# Patient Record
Sex: Male | Born: 1955 | Race: White | Hispanic: No | Marital: Single | Smoking: Current every day smoker
Health system: Southern US, Community
[De-identification: ages and names within clinical notes are randomized; demographics above are authoritative.]

## PROBLEM LIST (undated history)

## (undated) DIAGNOSIS — F1910 Other psychoactive substance abuse, uncomplicated: Secondary | ICD-10-CM

## (undated) DIAGNOSIS — M5412 Radiculopathy, cervical region: Secondary | ICD-10-CM

## (undated) DIAGNOSIS — F909 Attention-deficit hyperactivity disorder, unspecified type: Secondary | ICD-10-CM

## (undated) DIAGNOSIS — M8580 Other specified disorders of bone density and structure, unspecified site: Secondary | ICD-10-CM

## (undated) DIAGNOSIS — C801 Malignant (primary) neoplasm, unspecified: Secondary | ICD-10-CM

## (undated) DIAGNOSIS — N419 Inflammatory disease of prostate, unspecified: Secondary | ICD-10-CM

## (undated) DIAGNOSIS — Z86010 Personal history of colonic polyps: Secondary | ICD-10-CM

## (undated) HISTORY — DX: Personal history of colonic polyps: Z86.010

## (undated) HISTORY — PX: COLOSTOMY: SHX63

## (undated) HISTORY — PX: LUMBAR DISC SURGERY: SHX700

## (undated) HISTORY — PX: CERVICAL FUSION: SHX112

## (undated) HISTORY — DX: Other specified disorders of bone density and structure, unspecified site: M85.80

## (undated) HISTORY — DX: Radiculopathy, cervical region: M54.12

## (undated) HISTORY — DX: Inflammatory disease of prostate, unspecified: N41.9

## (undated) HISTORY — DX: Other psychoactive substance abuse, uncomplicated: F19.10

## (undated) HISTORY — DX: Attention-deficit hyperactivity disorder, unspecified type: F90.9

## (undated) HISTORY — PX: INGUINAL HERNIA REPAIR: SUR1180

---

## 1998-05-09 ENCOUNTER — Encounter: Payer: Self-pay | Admitting: Orthopedic Surgery

## 1998-05-09 ENCOUNTER — Ambulatory Visit (HOSPITAL_COMMUNITY): Admission: RE | Admit: 1998-05-09 | Discharge: 1998-05-09 | Payer: Self-pay | Admitting: Orthopedic Surgery

## 1998-05-23 ENCOUNTER — Ambulatory Visit (HOSPITAL_COMMUNITY): Admission: RE | Admit: 1998-05-23 | Discharge: 1998-05-23 | Payer: Self-pay | Admitting: Orthopedic Surgery

## 1998-06-06 ENCOUNTER — Ambulatory Visit (HOSPITAL_COMMUNITY): Admission: RE | Admit: 1998-06-06 | Discharge: 1998-06-06 | Payer: Self-pay | Admitting: Radiology

## 1998-06-06 ENCOUNTER — Encounter: Payer: Self-pay | Admitting: Radiology

## 1998-06-20 ENCOUNTER — Encounter: Payer: Self-pay | Admitting: Orthopedic Surgery

## 1998-06-20 ENCOUNTER — Ambulatory Visit (HOSPITAL_COMMUNITY): Admission: RE | Admit: 1998-06-20 | Discharge: 1998-06-20 | Payer: Self-pay | Admitting: Orthopedic Surgery

## 1998-07-18 ENCOUNTER — Encounter: Payer: Self-pay | Admitting: Emergency Medicine

## 1998-07-18 ENCOUNTER — Inpatient Hospital Stay (HOSPITAL_COMMUNITY): Admission: EM | Admit: 1998-07-18 | Discharge: 1998-07-24 | Payer: Self-pay | Admitting: Emergency Medicine

## 1998-10-30 ENCOUNTER — Ambulatory Visit (HOSPITAL_COMMUNITY): Admission: RE | Admit: 1998-10-30 | Discharge: 1998-10-30 | Payer: Self-pay

## 1998-11-05 ENCOUNTER — Inpatient Hospital Stay (HOSPITAL_COMMUNITY): Admission: RE | Admit: 1998-11-05 | Discharge: 1998-11-10 | Payer: Self-pay

## 1999-05-21 ENCOUNTER — Encounter: Payer: Self-pay | Admitting: Orthopedic Surgery

## 1999-05-21 ENCOUNTER — Ambulatory Visit (HOSPITAL_COMMUNITY): Admission: RE | Admit: 1999-05-21 | Discharge: 1999-05-21 | Payer: Self-pay | Admitting: Orthopedic Surgery

## 1999-08-19 HISTORY — PX: TOTAL HIP ARTHROPLASTY: SHX124

## 1999-08-26 ENCOUNTER — Encounter: Payer: Self-pay | Admitting: Orthopedic Surgery

## 1999-09-02 ENCOUNTER — Encounter: Payer: Self-pay | Admitting: Orthopedic Surgery

## 1999-09-02 ENCOUNTER — Inpatient Hospital Stay (HOSPITAL_COMMUNITY): Admission: RE | Admit: 1999-09-02 | Discharge: 1999-09-05 | Payer: Self-pay | Admitting: Orthopedic Surgery

## 1999-11-18 ENCOUNTER — Ambulatory Visit (HOSPITAL_COMMUNITY): Admission: RE | Admit: 1999-11-18 | Discharge: 1999-11-18 | Payer: Self-pay | Admitting: Radiology

## 1999-11-18 ENCOUNTER — Encounter: Payer: Self-pay | Admitting: Radiology

## 1999-12-04 ENCOUNTER — Encounter: Payer: Self-pay | Admitting: Neurological Surgery

## 1999-12-04 ENCOUNTER — Ambulatory Visit (HOSPITAL_COMMUNITY): Admission: RE | Admit: 1999-12-04 | Discharge: 1999-12-04 | Payer: Self-pay | Admitting: Neurological Surgery

## 2000-02-12 ENCOUNTER — Ambulatory Visit (HOSPITAL_COMMUNITY): Admission: RE | Admit: 2000-02-12 | Discharge: 2000-02-12 | Payer: Self-pay | Admitting: Gastroenterology

## 2000-02-20 ENCOUNTER — Encounter: Admission: RE | Admit: 2000-02-20 | Discharge: 2000-05-20 | Payer: Self-pay | Admitting: Neurological Surgery

## 2000-04-23 ENCOUNTER — Encounter: Payer: Self-pay | Admitting: Neurological Surgery

## 2000-04-27 ENCOUNTER — Encounter: Payer: Self-pay | Admitting: Neurological Surgery

## 2000-04-27 ENCOUNTER — Inpatient Hospital Stay (HOSPITAL_COMMUNITY): Admission: RE | Admit: 2000-04-27 | Discharge: 2000-04-30 | Payer: Self-pay | Admitting: Neurological Surgery

## 2000-05-06 ENCOUNTER — Encounter: Payer: Self-pay | Admitting: Neurological Surgery

## 2000-05-06 ENCOUNTER — Encounter: Admission: RE | Admit: 2000-05-06 | Discharge: 2000-05-06 | Payer: Self-pay | Admitting: Neurological Surgery

## 2000-06-19 ENCOUNTER — Encounter: Admission: RE | Admit: 2000-06-19 | Discharge: 2000-06-19 | Payer: Self-pay | Admitting: Neurological Surgery

## 2000-06-19 ENCOUNTER — Encounter: Payer: Self-pay | Admitting: Neurological Surgery

## 2000-07-29 ENCOUNTER — Encounter: Admission: RE | Admit: 2000-07-29 | Discharge: 2000-07-29 | Payer: Self-pay | Admitting: Neurological Surgery

## 2000-07-29 ENCOUNTER — Encounter: Payer: Self-pay | Admitting: Neurological Surgery

## 2000-08-04 ENCOUNTER — Ambulatory Visit (HOSPITAL_COMMUNITY): Admission: RE | Admit: 2000-08-04 | Discharge: 2000-08-04 | Payer: Self-pay | Admitting: Neurological Surgery

## 2000-08-04 ENCOUNTER — Encounter: Payer: Self-pay | Admitting: Neurological Surgery

## 2000-08-18 HISTORY — PX: OTHER SURGICAL HISTORY: SHX169

## 2000-08-21 ENCOUNTER — Encounter: Payer: Self-pay | Admitting: Neurological Surgery

## 2000-08-21 ENCOUNTER — Encounter: Admission: RE | Admit: 2000-08-21 | Discharge: 2000-08-21 | Payer: Self-pay | Admitting: Neurological Surgery

## 2001-01-13 ENCOUNTER — Ambulatory Visit (HOSPITAL_COMMUNITY): Admission: RE | Admit: 2001-01-13 | Discharge: 2001-01-13 | Payer: Self-pay | Admitting: Neurosurgery

## 2001-01-13 ENCOUNTER — Encounter: Payer: Self-pay | Admitting: Neurosurgery

## 2001-03-17 ENCOUNTER — Encounter: Payer: Self-pay | Admitting: Neurosurgery

## 2001-03-17 ENCOUNTER — Ambulatory Visit (HOSPITAL_COMMUNITY): Admission: RE | Admit: 2001-03-17 | Discharge: 2001-03-17 | Payer: Self-pay | Admitting: Neurosurgery

## 2001-05-07 ENCOUNTER — Encounter: Admission: RE | Admit: 2001-05-07 | Discharge: 2001-05-07 | Payer: Self-pay | Admitting: Neurological Surgery

## 2001-05-07 ENCOUNTER — Encounter: Payer: Self-pay | Admitting: Neurological Surgery

## 2001-11-12 ENCOUNTER — Encounter: Admission: RE | Admit: 2001-11-12 | Discharge: 2001-11-12 | Payer: Self-pay | Admitting: Sports Medicine

## 2002-04-29 ENCOUNTER — Encounter: Admission: RE | Admit: 2002-04-29 | Discharge: 2002-04-29 | Payer: Self-pay | Admitting: Sports Medicine

## 2002-11-29 ENCOUNTER — Encounter: Admission: RE | Admit: 2002-11-29 | Discharge: 2002-11-29 | Payer: Self-pay | Admitting: Sports Medicine

## 2004-01-26 ENCOUNTER — Encounter: Admission: RE | Admit: 2004-01-26 | Discharge: 2004-01-26 | Payer: Self-pay | Admitting: Sports Medicine

## 2005-08-08 ENCOUNTER — Ambulatory Visit: Payer: Self-pay | Admitting: Sports Medicine

## 2005-11-25 ENCOUNTER — Ambulatory Visit (HOSPITAL_COMMUNITY): Admission: RE | Admit: 2005-11-25 | Discharge: 2005-11-25 | Payer: Self-pay | Admitting: General Surgery

## 2006-08-21 ENCOUNTER — Ambulatory Visit (HOSPITAL_COMMUNITY): Admission: RE | Admit: 2006-08-21 | Discharge: 2006-08-21 | Payer: Self-pay | Admitting: Gastroenterology

## 2006-09-18 DIAGNOSIS — Z860100 Personal history of colon polyps, unspecified: Secondary | ICD-10-CM

## 2006-09-18 HISTORY — DX: Personal history of colon polyps, unspecified: Z86.0100

## 2006-09-18 HISTORY — DX: Personal history of colonic polyps: Z86.010

## 2006-10-28 ENCOUNTER — Telehealth: Payer: Self-pay | Admitting: Sports Medicine

## 2009-09-19 ENCOUNTER — Encounter: Payer: Self-pay | Admitting: Internal Medicine

## 2010-02-21 ENCOUNTER — Encounter: Admission: RE | Admit: 2010-02-21 | Discharge: 2010-02-21 | Payer: Self-pay | Admitting: Endocrinology

## 2010-09-19 NOTE — Letter (Signed)
Summary: [facility]  [facility]   Imported By: Sherian Rein 09/26/2009 10:21:49  _____________________________________________________________________  External Attachment:    Type:   Image     Comment:   External Document

## 2010-10-25 ENCOUNTER — Encounter: Payer: Self-pay | Admitting: Emergency Medicine

## 2010-11-05 ENCOUNTER — Encounter: Payer: Self-pay | Admitting: Emergency Medicine

## 2010-11-05 DIAGNOSIS — F1910 Other psychoactive substance abuse, uncomplicated: Secondary | ICD-10-CM | POA: Insufficient documentation

## 2010-11-05 DIAGNOSIS — M8580 Other specified disorders of bone density and structure, unspecified site: Secondary | ICD-10-CM

## 2010-11-05 DIAGNOSIS — M5412 Radiculopathy, cervical region: Secondary | ICD-10-CM

## 2010-11-05 DIAGNOSIS — N419 Inflammatory disease of prostate, unspecified: Secondary | ICD-10-CM

## 2010-11-05 DIAGNOSIS — Z86010 Personal history of colonic polyps: Secondary | ICD-10-CM

## 2010-11-06 ENCOUNTER — Ambulatory Visit (INDEPENDENT_AMBULATORY_CARE_PROVIDER_SITE_OTHER): Payer: 59 | Admitting: Emergency Medicine

## 2010-11-06 ENCOUNTER — Encounter: Payer: Self-pay | Admitting: Emergency Medicine

## 2010-11-06 DIAGNOSIS — R05 Cough: Secondary | ICD-10-CM

## 2010-11-06 DIAGNOSIS — R059 Cough, unspecified: Secondary | ICD-10-CM | POA: Insufficient documentation

## 2010-11-06 DIAGNOSIS — J449 Chronic obstructive pulmonary disease, unspecified: Secondary | ICD-10-CM

## 2010-11-06 NOTE — Progress Notes (Signed)
Subjective:    Patient ID: Corey Arnold, male    DOB: [birth year], 55 y.o.   MRN: [id number]  Cough Pertinent negatives include no chest pain, fever, postnasal drip, rhinorrhea, shortness of breath or wheezing.  55 yo man, hx tobacco use (40 pk-yrs). Followed by Dr Evlyn Kanner. He is quite active, has good exertional tolerance. He had some CP in January '12 (now resolved), prompted a CXR that showed hyperinflation and concern for COPD. He is a self referral to evaluate for COPD. Has been treated in past for bronchitis, no pred to his knowledge. No hospitalizations for breathing  Past Medical History  Diagnosis Date  . Prostatitis   . Substance abuse   . Osteopenia   . ADHD (attention deficit hyperactivity disorder)   . History of colon polyps 09/2006  . Cervical radiculopathy at C7     Family History  Problem Relation Age of Onset  . Adopted: Yes  . Pulmonary fibrosis Father   . Stroke Mother     History   Social History  . Marital Status: Legally Separated    Spouse Name: N/A    Number of Children: 2  . Years of Education: N/A   Occupational History  . president of [employer]    Social History Main Topics  . Smoking status: Former Smoker -- 1.0 packs/day for 40 years    Types: Cigarettes    Quit date: 09/02/2010  . Smokeless tobacco: Not on file  . Alcohol Use: Yes  . Drug Use: Yes  . Sexually Active: Not on file   Other Topics Concern  . Not on file   Social History Narrative  . No narrative on file    No Known Allergies  Outpatient Encounter Prescriptions as of 11/06/2010  Medication Sig Dispense Refill  . calcium carbonate (OS-CAL) 600 MG TABS Take 600 mg by mouth 2 (two) times daily with a meal.        . Cholecalciferol (VITAMIN D) 1000 UNITS capsule Take 1,000 Units by mouth daily.        . diazepam (VALIUM) 5 MG tablet 1 tablet by mouth two times a day       . fish oil-omega-3 fatty acids 1000 MG capsule Take 1 g by mouth daily.        Marland Kitchen  HYDROcodone-acetaminophen (VICODIN) 5-500 MG per tablet 1-2  At bedtime       . Multiple Vitamin (MULTIVITAMIN) tablet Take 1 tablet by mouth daily.        Marland Kitchen acetaminophen-codeine (TYLENOL #3) 300-30 MG per tablet Take 1 tablet by mouth twice a day           Review of Systems  Constitutional: Negative for fever, activity change, appetite change and fatigue.  HENT: Negative for congestion, rhinorrhea, sneezing, postnasal drip and sinus pressure.   Respiratory: Positive for cough (baseline, in the am - prod clear/white). Negative for chest tightness, shortness of breath, wheezing and stridor.   Cardiovascular: Negative for chest pain.  Musculoskeletal: Positive for back pain.       Objective:   Physical Exam  Constitutional: He is oriented to person, place, and time. He appears well-developed and well-nourished. No distress.  HENT:  Head: Normocephalic and atraumatic.  Mouth/Throat: No oropharyngeal exudate.  Eyes: Conjunctivae are normal. Pupils are equal, round, and reactive to light.  Neck: Normal range of motion. Neck supple. No tracheal deviation present. No thyromegaly present.  Cardiovascular: Normal rate, regular rhythm and normal heart sounds.  Exam reveals  no gallop and no friction rub.   No murmur heard. Pulmonary/Chest: Effort normal and breath sounds normal. No stridor. No respiratory distress. He has no wheezes. He has no rales. He exhibits no tenderness.  Abdominal: Soft.  Musculoskeletal: Normal range of motion. He exhibits no edema.  Lymphadenopathy:    He has no cervical adenopathy.  Neurological: He is alert and oriented to person, place, and time.  Skin: Skin is warm and dry. He is not diaphoretic. No erythema.          Assessment & Plan:  Cough Resolved. Occurred in setting URI.   COPD (chronic obstructive pulmonary disease) Hyperinflation on CXR in pt with tobacco hx. No pulm limitation  At this time. Most important thing was smoking cessation which he  has done.  - full PFT to assess degree AFL - ROV to review

## 2010-11-06 NOTE — Assessment & Plan Note (Signed)
Resolved. Occurred in setting URI.

## 2010-11-06 NOTE — Assessment & Plan Note (Signed)
Hyperinflation on CXR in pt with tobacco hx. No pulm limitation  At this time. Most important thing was smoking cessation which he has done.  - full PFT to assess degree AFL - ROV to review

## 2010-11-06 NOTE — Progress Notes (Deleted)
  Subjective:    Patient ID: Corey Arnold, male    DOB: 1956/08/04, 55 y.o.   MRN: [id number]  HPI    Review of Systems  Respiratory: Positive for cough.   Cardiovascular: Positive for chest pain.  Musculoskeletal: Positive for arthralgias.  Neurological: Positive for headaches.       Objective:   Physical Exam        Assessment & Plan:

## 2010-11-06 NOTE — Patient Instructions (Signed)
We will perform full Pulmonary Function Tests at your next visit Follow up with Dr Delton Coombes next available appointment to review the results.

## 2010-12-03 ENCOUNTER — Ambulatory Visit (INDEPENDENT_AMBULATORY_CARE_PROVIDER_SITE_OTHER): Payer: BC Managed Care – PPO | Admitting: Emergency Medicine

## 2010-12-03 DIAGNOSIS — R05 Cough: Secondary | ICD-10-CM

## 2010-12-03 DIAGNOSIS — R059 Cough, unspecified: Secondary | ICD-10-CM

## 2010-12-03 DIAGNOSIS — J449 Chronic obstructive pulmonary disease, unspecified: Secondary | ICD-10-CM

## 2010-12-03 LAB — PULMONARY FUNCTION TEST

## 2010-12-03 NOTE — Progress Notes (Signed)
PFT done today. 

## 2010-12-11 ENCOUNTER — Encounter: Payer: Self-pay | Admitting: Emergency Medicine

## 2010-12-11 ENCOUNTER — Ambulatory Visit (INDEPENDENT_AMBULATORY_CARE_PROVIDER_SITE_OTHER): Payer: BC Managed Care – PPO | Admitting: Emergency Medicine

## 2010-12-11 VITALS — BP 118/82 | HR 58 | Temp 98.9°F | Ht 70.0 in | Wt 172.8 lb

## 2010-12-11 DIAGNOSIS — J4489 Other specified chronic obstructive pulmonary disease: Secondary | ICD-10-CM

## 2010-12-11 DIAGNOSIS — J449 Chronic obstructive pulmonary disease, unspecified: Secondary | ICD-10-CM

## 2010-12-11 NOTE — Progress Notes (Signed)
  Subjective:    Patient ID: Corey Arnold, male    DOB: [birth year], 55 y.o.   MRN: [id number]  HPI 55 yo man, hx tobacco 40 pk-yrs.   Pertinent negatives include no chest pain, fever, postnasal drip, rhinorrhea, shortness of breath or wheezing.  55 yo man, hx tobacco use (40 pk-yrs). Followed by Dr Evlyn Kanner. He is quite active, has good exertional tolerance. He had some CP in January '12 (now resolved), prompted a CXR that showed hyperinflation and concern for COPD. He is a self referral to evaluate for COPD. Has been treated in past for bronchitis, no pred to his knowledge. No hospitalizations for breathing  ROV 12/11/10 -- returns for f/u eval for hyperinflation and COPD. He is asymptomatic, but had hyperinflation on CXR. His PFT support mild AFL with a positive BD response, elevated TLC, normal DLCO.      Review of Systems All negative except nasal drainage.     Objective:   Physical Exam Gen: Pleasant, well-nourished, in no distress,  normal affect  ENT: No lesions,  mouth clear,  oropharynx clear, no postnasal drip  Neck: No JVD, no TMG, no carotid bruits  Lungs: No use of accessory muscles, no dullness to percussion, clear without rales or rhonchi  Cardiovascular: RRR, heart sounds normal, no murmur or gallops, no peripheral edema  Musculoskeletal: No deformities, no cyanosis or clubbing  Neuro: alert, non focal  Skin: Warm, no lesions or rashes           Assessment & Plan:

## 2010-12-11 NOTE — Patient Instructions (Signed)
Your pulmonary function testing shows evidence for mild airflow limitation In absence of any symptoms, you do not need to be on medications at this time.  Follow up with Dr Delton Coombes if your breathing changes in any way.

## 2010-12-11 NOTE — Assessment & Plan Note (Signed)
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 Asymptomatic Discussed the ramifications of the findings, may become clinically relevant in the future, probably should get annual flu shot. Will follow prn

## 2011-01-03 NOTE — Procedures (Signed)
[place]. [facility]  Patient:    Corey Arnold, Corey Arnold                       MRN: [id number] Proc. Date: 02/12/00 Adm. Date:  [id number] Attending:  Charna Elizabeth CC:         Tera Mater. Evlyn Kanner, M.D.                           Procedure Report  PROCEDURE: Colonoscopy.  ENDOSCOPIST: Anselmo Rod, M.D.  INDICATIONS: Rectal bleeding. The patient is a 55 year old white male who has had colostomy for reversal of the same for diverticulitis in the past, rule out colonic polyps, hemorrhoids. Informed consent was procured from the patient. The patient fasted for eight hours prior to the procedure and was prepped with a bottle of magnesium citrate and a gallon of NuLytely the night prior to the procedure.  PHYSICAL EXAMINATION:  VITAL SIGNS:  Vital signs are stable.  NECK: Supple.  CHEST:  Clear to auscultation.  HEART:  S1 and S2 normal.  ABDOMEN:  Soft. Well-healed surgical scar. Nontender with normal bowel sounds.  DESCRIPTION OF PROCEDURE:  The patient was placed in the left lateral decubitus position and sedated with 80 mg of Demerol and 8 mg of Versed intravenously. Once the patient was adequately sedated and maintained on low-flow oxygen, with cardiac monitoring, the Olympus video colonoscope was advanced from the rectum to the cecum without difficulty. The terminal ileum appeared healthy and so did the entire colon, except for a few diverticular pockets. An inverted diverticulum was seen at about 30 cm. The patient had small internal hemorrhoids that were nonbleeding at the time of the examination. No masses or polyps were seen. The patient tolerated the procedure well without complications.  IMPRESSION: 1. Essentially healthy-appearing colon up to the terminal ileum, except for a    few scattered diverticula. 2. Small, nonbleeding internal hemorrhoids. 3. No masses or polyps seen.  RECOMMENDATIONS:  The patient was advised to increase fluid and  fiber in his diet and followup in the office in an outpatient setting on a p.r.n. basis. Repeat colonoscopy is recommended in 5-10 years or earlier if need be. DD:  02/12/00 TD:  02/13/00 Job: [id number]

## 2011-01-03 NOTE — Op Note (Signed)
NAME:  OMEGA, SLAGER NO.:  [id number]   MEDICAL RECORD NO.:  [id number]          PATIENT TYPE:  AMB   LOCATION:  DAY                          FACILITY:  [facility]   PHYSICIAN:  Timothy E. Earlene Plater, M.D. DATE OF BIRTH:  [birth year]   DATE OF PROCEDURE:  11/25/2005  DATE OF DISCHARGE:                                 OPERATIVE REPORT   PREOPERATIVE DIAGNOSIS:  Anal condylomata.   POSTOPERATIVE DIAGNOSIS:  Anal condylomata.   PROCEDURE:  Laser destruction of anal condylomata.   SURGEON:  Timothy E. Earlene Plater, M.D.   ANESTHESIA:  General.   HISTORY:  This 55 year old male was otherwise healthy.  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  He has developed a condylomata around the  perianal area, previously treated on the genitalia.  He is well informed and  has been consulted in regards to laser destruction.  He also knows  recurrence is high.   DESCRIPTION OF PROCEDURE:  The patient was seen and identified, and permit  signed.  The patient was taken to the operating room, placed supine.  LIMA  anesthesia was provided.  He was placed in the lithotomy position.  The anal  area was clipped, prepped and draped in the usual fashion.  Careful  inspection was done using magnification.  Chondylomata were numerous;  intended to be on the elevation of the normal skin folds; however, there  were condylomata in the anoderm as well as the anodermal columnar junction.  All these areas were carefully treated with laser destruction.  CO2 laser  set on continuous, between 3 and 5 watts, and each individual condylomata  was destroyed.  Care was taken to preserve as much normal skin as possible,  but also to treat every condylomata present.  This was carried out both  using anoscope and on the external skin.  All areas were checked.  There  were no complications.  A dibucaine ointment was applied and dressings.   The patient has been carefully informed about postoperative care.  He has  Vicodin, which he will use.  I will see him in the office.      Timothy E. Earlene Plater, M.D.  Electronically Signed     TED/MEDQ  D:  11/25/2005  T:  11/25/2005  Job:  [id number]   cc:   Anselmo Rod, M.D.  Fax: [phone]   Tera Mater. Evlyn Kanner, M.D.  Fax: [phone]

## 2011-01-03 NOTE — Discharge Summary (Signed)
[place]. [facility]  Patient:    TEO, MOEDE                       MRN: [id number] Adm. Date:  [id number] Disc. Date: [id number] Attending:  Jonne Ply                           Discharge Summary  ADMITTING DIAGNOSIS:  Lumbar spondylolisthesis grade 3 with radiculopathy.  DISCHARGE DIAGNOSIS:  Lumbar spondylolisthesis grade 3 with radiculopathy.  OPERATION:  Lumbar ______ procedure plus arthrodesis L5/S1.  CONDITION ON DISCHARGE:  Improving.  HOSPITAL COURSE:  The patient is a 55 year old individual who has had significant back and bilateral leg pain.  He has a grade 3 spondylolisthesis at the L5/S1 level.  He has had progressive radicular pain.  Patient was advised regarding surgical decompression and stabilization.  This was performed via ______ procedure at L5 and L4 to sacrum arthrodesis.  He tolerated procedure well, however, he continues to have significant problems with spasms in his hamstrings.  His incision is clean, dry.  He is ambulatory at this time and overall his level of function is improving.  His pain has been controlled with Percocet orally and Valium as a muscle relaxer.  He will be seen in the office in three weeks time for further follow-up.  ______ DD:  04/30/00 TD:  05/03/00 Job: [id number]

## 2011-01-03 NOTE — Op Note (Signed)
Corey Arnold. [facility]  Patient:    Arnold, Corey                       MRN: [id number] Proc. Date: 01/13/01 Adm. Date:  [id number] Attending:  Donn Pierini                           Operative Report  PREOPERATIVE DIAGNOSES: 1. Right C4-5 herniated nucleus pulposus with radiculopathy. 2. Left C5-6 herniated nucleus pulposus with radiculopathy.  POSTOPERATIVE DIAGNOSES: 1. Right C4-5 herniated nucleus pulposus with radiculopathy. 2. Left C5-6 herniated nucleus pulposus with radiculopathy.  PROCEDURE:  C4-5 and C5-6 anterior cervical diskectomy and fusion with allograft, anterior plate instrumentation.  SURGEON:  Julio Sicks, M.D.  ASSISTANT:  Donalee Citrin, Montez Hageman., M.D.  ANESTHESIA:  General endotracheal.  INDICATIONS:  Mr. Corey Arnold is a 55 year old male with history of chronic neck and bilateral upper extremity symptoms, lately his right-sided symptoms being more pronounced.  These symptoms are quite typical for a right-sided C5 radiculopathy.  He has developed weakness in his right-sided deltoid and biceps muscle groups.  He also has chronic left-sided C6 radicular paresthesias and numbness with some intermittent weakness.  MRI scanning demonstrates a rightward C4-5 disk herniation and compression of the right-sided C5 nerve root.  At C5-6 there is a leftward disk herniation with compression of the left-sided C6 nerve root.  At C3-4, there is spondylosis, which causes some mild spinal stenosis and no significant areas of spinal nerve root compression.  The patient has been counseled as to his options.  he has failed conservative management.  The development of worsening weakness in his right-sided C5 myotome is worrisome, and we have discussed options for management, including undergoing a C4-5 and C5-6 anterior cervical diskectomy and fusion with allograft, anterior plating.  The patient is aware of the risks and benefits and wishes to proceed with  surgery.  DESCRIPTION OF PROCEDURE:  Patient in the operating room, placed on the operating table in the supine position.  After an adequate level of anesthesia was achieved, the patient was positioned supine with his neck slightly extended and held in place with Holter traction.  The patients anterior cervical region was shaved and prepped sterilely.  A 10 blade was used to make a linear skin incision overlying the C5 level.  This was carried down sharply to the platysma.  The platysma was then divided vertically and dissection proceeded along the medial border of the sternocleidomastoid muscle and carotid sheath on the right, trachea and esophagus were mobilized and retracted toward the left.  Prevertebral fascia was stripped off the anterior spinal column.  The longus colli muscle was then elevated bilaterally using electrocautery.  Deep self-retaining retractor was placed.  Intraoperative fluoroscopy was used, and the C4-5 and C5-6 levels were confirmed.  The disk spaces were then incised at both levels with 15 blade in rectangular fashion. A wide disk space cleanout was then performed at each level using pituitary rongeurs, forward and backward-angled Karlin curettes, Kerrison rongeurs, and high-speed drill.  All elements of the disk were removed down to the level of the posterior annulus at each level.  The microscope was brought in the field and used throughout the remainder of the diskectomies.  Starting first at C4-5, remaining aspects of the annulus and osteophytes were removed down to the level of the posterior longitudinal ligament.  The posterior longitudinal ligament was then elevated and resected  in piecemeal fashion using Kerrison rongeurs.  The underlying thecal sac was identified.  The decompression then proceeded out to the left-sided C5 foramen.  The nerve root was identified proximally, found to be free of any compression.  Decompression then proceeded out to the  right-sided C5 foramen.  A moderate amount of intraligamentous disk herniation was encountered.  A wide anterior foraminotomy was performed along the course of the right-sided C5 nerve root.  At the conclusion, there was no evidence of any compression of the thecal sac or exiting nerve roots. Attention then placed at the C5-6 level.  Once again the remaining aspects of the annulus and osteophytes were removed down to the level of the posterior longitudinal ligament.  The posterior longitudinal ligament was then elevated and resected in piecemeal fashion using Kerrison rongeurs.  The underlying thecal sac was identified.  A wide central decompression then performed using Kerrison rongeurs.  Decompression then proceeded out to the left-sided C6 foramen.  Once again an intraligamentous disk herniation was encountered and then completely resected using the Kerrison rongeurs.  The C6 nerve root was followed out distally into the foramen, and a wide anterior foraminotomy was performed.  The C6 nerve root was identified on the right side and decompressed as well.  At this point, there was no evidence of any spinal stenosis or nerve root compression at either level.  The wound was then copiously irrigated with antibiotic solution with Gelfoam placed topically at both levels for hemostasis, which was found to be good.  The disk space was then distracted, first at C4-5, and a 6 mm fibular wedge allograft was impacted into place, recessed approximately 1 mm from the anterior cortical surface.  At C5-6, the disk space was distracted and a 6 mm fibular wedge allograft was impacted in place, recessed approximately 1 mm from the anterior cortical surface.  A 40 mm Atlantis anterior cervical plate was then placed over the C4, C5, and C6 levels.  This was attached under fluoroscopic guidance by drilling pilot holes, tapping the pilot holes, and subsequently placing fixed-angled cancellous screws.  Fourteen  millimeter cancellous fixed-angled screws were placed, two each at C4, C5, and C6.  All screws were given a final tightening and found to be solidly within the bone.  All locking caps were  then engaged.  Retraction system removed, and final images revealed good position of bone grafts and hardware and proper operative level, with normal alignment of the spine.  Hemostasis was ensured with the bipolar electrocautery.  The wound was copiously irrigated with antibiotic solution and then closed in a routine fashion.  There was no apparent complication. The patient tolerated the procedure well, and he returns to the recovery room postoperatively. DD:  01/13/01 TD:  01/13/01 Job: [id number]

## 2011-01-03 NOTE — H&P (Signed)
[place]. [facility]  Patient:    Corey Arnold, Corey Arnold                       MRN: [id number] Adm. Date:  [id number] Disc. Date: [id number] Attending:  Charna Arnold                         History and Physical  ADMISSION DIAGNOSIS:  Spondylolisthesis L5-S1 with bilateral L5 radiculopathy.  HISTORY OF PRESENT ILLNESS:  The patient is a 55 year old ambidextrous individual who has chronic back pain for a period of about three years.  It has become increasingly more severe and manifests itself as constant low back pain with burning sensation and aching into his lower extremities.  He was treated by Dr. Jene Every and surgery had been discussed for further consideration.  He is seen at the request of Dr. Adrian Prince for further consideration of intervention.  He was initially seen in May 2000.  PAST MEDICAL HISTORY:  Reveals that the patients general health has been good; however, he had emergency surgery for a ruptured colon.  He has a colostomy which was reversed in December 2000.  He has been using Vioxx 25 mg a day for back and hip pain.  He uses Vicodin at bedtime for pain and Levsin as needed for bowel cramping.  He has had difficulties with left leg pain and difficulties with his hips, having undergone a hip replacement in the recent past.  Worse problems are exacerbated by walking any distance.  The patient was re-evaluated this past April and at that time he was having difficulty that he could not stand for more than five minutes time without experiencing severe back pain and leg pain.  He had been using Vicodin two to three per day.  SOCIAL HISTORY:  Reveals that he is recently married.  HABITS:  Note that the patient does not smoke.  He does not drink alcohol.  He quit smoking a couple of years ago and smoked from 44 to 1979.  He exercises regularly including bicycling and swimming.  REVIEW OF SYSTEMS:  Notable for abdominal pain occasionally.  Leg  pain, joint pain, swelling, arthritis and night sweats.  MEDICATIONS:  Vioxx 25 mg a day.  Glucosamine 1500 mg a day.  Vicodin as needed for pain and Levsin.  FAMILY HISTORY:  Negative for any significant medical problems.  PHYSICAL EXAMINATION:  NEUROLOGIC:  He is an alert, oriented, cooperative individual in overt distress.  He will stand straight and erect without difficulty.  Flexes forward touching his fingertips to his toes.  His motor strength is good in iliopsoas, quadriceps, tibialis anterior, and gastrocnemius.  Deep tendon reflexes are 2+ in the patella, 1+ in the Achilles. Babinskis are downgoing. Straight leg raising reproduces leg pain at 45 degrees in either lower extremity.  Patricks maneuver is negative bilaterally.  Sensation is intact to pin and light touch in the distal lower extremities.  Cranial nerve examination reveals pupils are 4 mm briskly reactive to light and accommodation.  The extraocular movements are full.  The face is symmetric to grimace.  Tongue and uvula are in the midline.  Sclerae and conjunctivae are clear.  NECK:  Reveals a normal range of motion.  No masses are palpable.  No bruits are heard.  LUNGS:  Clear to auscultation.  HEART:  Regular rate and rhythm.  ABDOMEN:  Soft.  Bowel sounds are positive.  No masses  are palpable.  EXTREMITIES:  No cyanosis, clubbing or edema.  IMPRESSION:  The patient has a spondylolithesis that is grade 2 to grade 3 at L5-S1.  He is now being admitted to undergo surgical decompression and stabilization. DD:  04/27/00 TD:  04/27/00 Job: [id number]

## 2011-01-03 NOTE — Op Note (Signed)
[place]  Patient:    Corey Arnold, Corey Arnold                       MRN: [id number] Proc. Date: 04/27/00 Adm. Date:  [id number] Attending:  Jonne Ply                           Operative Report  PREOPERATIVE DIAGNOSIS:  L5-S1 spondylolisthesis grade 3 with bilateral L5 radiculopathies.  POSTOPERATIVE DIAGNOSIS:  L5-S1 spondylolisthesis grade 3 with bilateral L5 radiculopathies.  OPERATION:  Lumbar Gil procedure L5, fixation from L4 to sacrum with pedicle screws, posterolateral fusion with local autograft and allograft.  SURGEON:  Stefani Dama, M.D.  ASSISTANT:  Payton Doughty, M.D.  ANESTHESIA:  General endotracheal.  INDICATIONS:   Patient is a 55 year old individual, who has had significant back and bilateral leg pain over a 2-year period of time.  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  Nonetheless, he has had persistent L5 radiculopathy and was found to have a grade 3 spondylolisthesis.  This had proceeded from a grade 2 spondylolisthesis noted about two years ago.  Patient was advised regarding surgical decompression and stabilization and is taken to the operating room.  DESCRIPTION OF PROCEDURE:  The patient was brought to the operating room supine on the stretcher.  After smooth induction of general endotracheal anesthesia, he was turned prone.  The back was shaved, prepped with Duraprep and draped in a sterile fashion.  A midline incision was created and carried down to the lumbodorsal fascia.  First identifiable spinous process was noted to be that of L4 on a radiograph.  Dissection was carried inferiorly and superiorly to expose all of L4, L5 and sacrum. L5 was noted to be rather sclerotic, though with dissection and further curettage around the area of the facet, the listhetic segment could be loosened and moved.  This was removed in a piecemeal fashion out to the pseudoarthritic  components identifying the L5 nerve root in the depth and protecting it carefully.  The decompression was performed completing a Bronson Curb procedure over the L5 laminar arch and pseudoarthrosis.  Initially, it was planned that a posterior interbody arthrodesis at L5-S1 would be completed.  However, when identifying the pads of the L5 nerve roots and its relationship to the L5-S1 disk space, it was felt that this could not be done safely.  The dissection was then carried out to L4.  It was noted that to decompress the L5 nerve roots and the take off, a considerable amount of the facet of L4-5 would need to be sacrificed.  This would further destabilize the L4 interspace.  It was felt then at that point that an arthrodesis from L4 to the sacrum would need to be undertaken. Dissection was carried out to the transverse process of L4, transverse process of L5 and the sacral ala.  A self-retaining retractor was placed deep into the wound at this point.  Pedicle entry sites were then chosen at L4.  After decompressing the facet joints at L5 bilaterally, pedicle entry sites were chosen at L5 and the sacral ala.  These were sequentially identified and localized with radiographic confirmation.  Then, pedicle screws were placed in L4, these were 6.2 x 40 mm length screws from the [employer] system.  The L5 was instrumented with 6.2 x 45 mm screws in the sacrum, 6.2 x 50 mm  screws were placed.  Once these were located, the screw heads were reamed to the appropriate size to allow placement of the screw caps.  The transverse processes from L4 to the sacral ala were decorticated amply.  The laminar arch from the [facility] procedure was harvested and used for local autograft.  A good sample of bone was obtained in this fashion.  Vitoss bone scaffold was added in the quantity of 10 cc mixed with blood to extend the bone sample.  With the pedicle screws being placed, the screw heads were applied.  A 65 mm rod  was trimmed to the appropriate length and placed between the screw heads and then the screw heads were sequentially locked and tightened.  Care was taken in the end to make sure that the L5 nerve roots were well exposed and decompressed bilaterally.  With this being ascertained, the bone graft was packed into the lateral gutters.  After a bone graft was packed, L5 nerve roots were again checked for patency out in the foramen.  Two small fat grafts were placed over the L5 nerve roots to protect them.  Then, the lumbodorsal fascia was closed with #1 Vicryl interrupted fashion, 2-0 Vicryl was used in the subcutaneous/subcuticular fashion, 3-0 Vicryl was used subcuticularly for the final layer.  Blood loss was estimated at 700 cc.  One unit of cell-saver blood was returned to the patient. DD:  04/27/00 TD:  04/27/00 Job: [id number]

## 2011-01-23 ENCOUNTER — Encounter: Payer: Self-pay | Admitting: Emergency Medicine

## 2011-01-28 NOTE — Progress Notes (Signed)
Lawson Fiscal, do I need to refill a med here? RB

## 2011-02-03 NOTE — Progress Notes (Signed)
Nothing needed for this patient.

## 2011-02-10 ENCOUNTER — Encounter: Payer: Self-pay | Admitting: Emergency Medicine

## 2012-01-13 ENCOUNTER — Emergency Department (HOSPITAL_COMMUNITY): Payer: BC Managed Care – PPO

## 2012-01-13 ENCOUNTER — Emergency Department (HOSPITAL_COMMUNITY)
Admission: EM | Admit: 2012-01-13 | Discharge: 2012-01-13 | Disposition: A | Discharge status: Home / Self Care | Payer: BC Managed Care – PPO | Attending: Emergency Medicine | Admitting: Emergency Medicine

## 2012-01-13 ENCOUNTER — Encounter (HOSPITAL_COMMUNITY): Payer: Self-pay | Admitting: *Deleted

## 2012-01-13 ENCOUNTER — Encounter (HOSPITAL_COMMUNITY): Payer: Self-pay | Admitting: Emergency Medicine

## 2012-01-13 ENCOUNTER — Ambulatory Visit (HOSPITAL_COMMUNITY): Admission: RE | Admit: 2012-01-13 | Payer: BC Managed Care – PPO | Source: Ambulatory Visit

## 2012-01-13 DIAGNOSIS — S61519A Laceration without foreign body of unspecified wrist, initial encounter: Secondary | ICD-10-CM

## 2012-01-13 DIAGNOSIS — M25539 Pain in unspecified wrist: Secondary | ICD-10-CM | POA: Insufficient documentation

## 2012-01-13 DIAGNOSIS — Z87891 Personal history of nicotine dependence: Secondary | ICD-10-CM | POA: Insufficient documentation

## 2012-01-13 DIAGNOSIS — Z23 Encounter for immunization: Secondary | ICD-10-CM | POA: Insufficient documentation

## 2012-01-13 DIAGNOSIS — Z981 Arthrodesis status: Secondary | ICD-10-CM | POA: Insufficient documentation

## 2012-01-13 DIAGNOSIS — F909 Attention-deficit hyperactivity disorder, unspecified type: Secondary | ICD-10-CM | POA: Insufficient documentation

## 2012-01-13 DIAGNOSIS — W268XXA Contact with other sharp object(s), not elsewhere classified, initial encounter: Secondary | ICD-10-CM | POA: Insufficient documentation

## 2012-01-13 DIAGNOSIS — S61209A Unspecified open wound of unspecified finger without damage to nail, initial encounter: Secondary | ICD-10-CM | POA: Insufficient documentation

## 2012-01-13 DIAGNOSIS — S61509A Unspecified open wound of unspecified wrist, initial encounter: Secondary | ICD-10-CM | POA: Insufficient documentation

## 2012-01-13 MED ORDER — TETANUS-DIPHTH-ACELL PERTUSSIS 5-2.5-18.5 LF-MCG/0.5 IM SUSP
0.5000 mL | Freq: Once | INTRAMUSCULAR | Status: AC
  Administered 2012-01-13: 0.5 mL via INTRAMUSCULAR
  Filled 2012-01-13 (×2): qty 0.5

## 2012-01-13 MED ORDER — HYDROCODONE-ACETAMINOPHEN 5-500 MG PO TABS: 1.0000 | ORAL_TABLET | Freq: Four times a day (QID) | ORAL | Status: AC | PRN

## 2012-01-13 NOTE — ED Notes (Signed)
Patient given discharge instructions; went over discharge instructions with patient.  Patient instructed to keep wound clean and dry, to follow up with [facility]/PCP/ED for suture removal in 9-10 days, to take Vicodin as directed, to not drive while taking narcotics, and to return to the ED for new, worsening, or concerning symptoms.

## 2012-01-13 NOTE — ED Notes (Signed)
Patient had laceration on right wrist sutured this morning; patient had to leave to attend a meeting and was told to come back this afternoon to have x-ray done.  Patient reports pain; rate [place] 5/10 on the numerical pain scale; describes pain as "burning" and "throbbing".  Gauze applied over site; no active bleeding noted.  Patient alert and oriented x4; PERRL present.  Will continue to monitor.

## 2012-01-13 NOTE — ED Provider Notes (Addendum)
History     CSN: [id number]  Arrival date & time 01/13/12  1626   None     Chief Complaint  Patient presents with  . Laceration    (Consider location/radiation/quality/duration/timing/severity/associated sxs/prior treatment) HPI  Pt seen in ed earlier with lac to wrist. Had to leave prior to xray, returned for xray.  No problems w wound. No numbness or severe pain, no new c/o.       Past Medical History  Diagnosis Date  . Prostatitis   . Substance abuse   . Osteopenia   . ADHD (attention deficit hyperactivity disorder)   . History of colon polyps 09/2006  . Cervical radiculopathy at C7     Past Surgical History  Procedure Date  . Bcca 2002  . Total hip arthroplasty 2001  . Colostomy   . Inguinal hernia repair   . Cervical fusion     C4 - C6  . Lumbar disc surgery     Family History  Problem Relation Age of Onset  . Adopted: Yes  . Pulmonary fibrosis Father   . Stroke Mother     History  Substance Use Topics  . Smoking status: Former Smoker -- 1.0 packs/day for 40 years    Types: Cigarettes    Quit date: 09/02/2010  . Smokeless tobacco: Not on file  . Alcohol Use: Yes      Review of Systems  Constitutional: Negative for fever.  Neurological: Negative for numbness.    Allergies  Latex  Home Medications   Current Outpatient Rx  Name Route Sig Dispense Refill  . CALCIUM CARBONATE 600 MG PO TABS Oral Take 600 mg by mouth 2 (two) times daily with a meal.      . VITAMIN D 1000 UNITS PO CAPS Oral Take 1,000 Units by mouth daily.      . DEXLANSOPRAZOLE 60 MG PO CPDR Oral Take 60 mg by mouth daily.    Marland Kitchen DIAZEPAM 5 MG PO TABS Oral Take 5 mg by mouth at bedtime as needed. For spasms    . OMEGA-3 FATTY ACIDS 1000 MG PO CAPS Oral Take 1 g by mouth daily.     Marland Kitchen HYDROCODONE-ACETAMINOPHEN 5-500 MG PO TABS Oral Take 1 tablet by mouth at bedtime as needed. For pain    . ONE-DAILY MULTI VITAMINS PO TABS Oral Take 1 tablet by mouth daily.       BP 144/89   Pulse 64  Temp(Src) 98.2 F (36.8 C) (Oral)  SpO2 97%  Physical Exam  Nursing note and vitals reviewed. Constitutional: He appears well-developed and well-nourished. No distress.  HENT:  Head: Atraumatic.  Neck: No tracheal deviation present.  Cardiovascular: Normal rate.   Pulmonary/Chest: Effort normal. No accessory muscle usage. No respiratory distress.  Musculoskeletal:       Sutured wound right wrist without infection. Radial pulse 2+.   Neurological: He is alert.  Skin: Skin is warm and dry.  Psychiatric: He has a normal mood and affect.    ED Course  Procedures (including critical care time)  Labs Reviewed - No data to display Dg Wrist Complete Right  01/13/2012  *RADIOLOGY REPORT*  Clinical Data: Lacerated wrist broken glass.  Wrist pain  RIGHT WRIST - COMPLETE 3+ VIEW  Comparison: None.  Findings: Mild soft tissue swelling is seen along the volar and radial aspect the wrist joint, however there is no evidence of radiopaque foreign body.  No evidence of fracture or dislocation. No other significant bone abnormality identified.  IMPRESSION:  Mild soft tissue swelling.  No evidence of fracture or radiopaque foreign body.  Original Report Authenticated By: Danae Orleans, M.D.        MDM  Xray neg for fb.   Pt requests pain rx for home.        [name], MD 01/13/12 1710  [name], MD 01/13/12 [phone]

## 2012-01-13 NOTE — ED Notes (Signed)
Called x-ray states will send for patient shortly. Patient states needs to leave for a meeting EDP notified.

## 2012-01-13 NOTE — Discharge Instructions (Signed)
Take motrin or aleve as need for pain. You may also take vicodin as need for pain. No driving when taking vicodin. Also, do not take tylenol or acetaminophen containing medication when taking vicodin. Sutures out in 9-10 days, your doctor urgent care. Return to ER if worse, infection of wound, severe pain, numbness, weakness, other concern.         Laceration Care, Adult A laceration is a cut or lesion that goes through all layers of the skin and into the tissue just beneath the skin. TREATMENT  Some lacerations may not require closure. Some lacerations may not be able to be closed due to an increased risk of infection. It is important to see your caregiver as soon as possible after an injury to minimize the risk of infection and maximize the opportunity for successful closure. If closure is appropriate, pain medicines may be given, if needed. The wound will be cleaned to help prevent infection. Your caregiver will use stitches (sutures), staples, wound glue (adhesive), or skin adhesive strips to repair the laceration. These tools bring the skin edges together to allow for faster healing and a better cosmetic outcome. However, all wounds will heal with a scar. Once the wound has healed, scarring can be minimized by covering the wound with sunscreen during the day for 1 full year. HOME CARE INSTRUCTIONS  For sutures or staples:  Keep the wound clean and dry.   If you were given a bandage (dressing), you should change it at least once a day. Also, change the dressing if it becomes wet or dirty, or as directed by your caregiver.   Wash the wound with soap and water 2 times a day. Rinse the wound off with water to remove all soap. Pat the wound dry with a clean towel.   After cleaning, apply a thin layer of the antibiotic ointment as recommended by your caregiver. This will help prevent infection and keep the dressing from sticking.   You may shower as usual after the first 24 hours. Do not  soak the wound in water until the sutures are removed.   Only take over-the-counter or prescription medicines for pain, discomfort, or fever as directed by your caregiver.   Get your sutures or staples removed as directed by your caregiver.  For skin adhesive strips:  Keep the wound clean and dry.   Do not get the skin adhesive strips wet. You may bathe carefully, using caution to keep the wound dry.   If the wound gets wet, pat it dry with a clean towel.   Skin adhesive strips will fall off on their own. You may trim the strips as the wound heals. Do not remove skin adhesive strips that are still stuck to the wound. They will fall off in time.  For wound adhesive:  You may briefly wet your wound in the shower or bath. Do not soak or scrub the wound. Do not swim. Avoid periods of heavy perspiration until the skin adhesive has fallen off on its own. After showering or bathing, gently pat the wound dry with a clean towel.   Do not apply liquid medicine, cream medicine, or ointment medicine to your wound while the skin adhesive is in place. This may loosen the film before your wound is healed.   If a dressing is placed over the wound, be careful not to apply tape directly over the skin adhesive. This may cause the adhesive to be pulled off before the wound is healed.  Avoid prolonged exposure to sunlight or tanning lamps while the skin adhesive is in place. Exposure to ultraviolet light in the first year will darken the scar.   The skin adhesive will usually remain in place for 5 to 10 days, then naturally fall off the skin. Do not pick at the adhesive film.  You may need a tetanus shot if:  You cannot remember when you had your last tetanus shot.   You have never had a tetanus shot.  If you get a tetanus shot, your arm may swell, get red, and feel warm to the touch. This is common and not a problem. If you need a tetanus shot and you choose not to have one, there is a rare chance of  getting tetanus. Sickness from tetanus can be serious. SEEK MEDICAL CARE IF:   You have redness, swelling, or increasing pain in the wound.   You see a red line that goes away from the wound.   You have yellowish-white fluid (pus) coming from the wound.   You have a fever.   You notice a bad smell coming from the wound or dressing.   Your wound breaks open before or after sutures have been removed.   You notice something coming out of the wound such as wood or glass.   Your wound is on your hand or foot and you cannot move a finger or toe.  SEEK IMMEDIATE MEDICAL CARE IF:   Your pain is not controlled with prescribed medicine.   You have severe swelling around the wound causing pain and numbness or a change in color in your arm, hand, leg, or foot.   Your wound splits open and starts bleeding.   You have worsening numbness, weakness, or loss of function of any joint around or beyond the wound.   You develop painful lumps near the wound or on the skin anywhere on your body.  MAKE SURE YOU:   Understand these instructions.   Will watch your condition.   Will get help right away if you are not doing well or get worse.  Document Released: 08/04/2005 Document Revised: 07/24/2011 Document Reviewed: 01/28/2011 [facility] Patient Information 2012 [place], [place].

## 2012-01-13 NOTE — ED Notes (Signed)
Patient seen in ED today for laceration right wrist.  Stated had to leave returned to ED for x-ray right wrist per Doctor's recommendation.
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
## 2012-01-13 NOTE — ED Notes (Signed)
Patient spoke with Doctor and Nurse stated he needs to leave recommendation to wait for x-ray and if must leave return for x-ray.  Verbalized understanding.

## 2012-01-13 NOTE — Discharge Instructions (Signed)
Keep wound very clean. Avoid strong flexion or extension of wrist/digits until after sutures out avoid tearing sutures.  Have sutures removed, your doctor or urgent care in 9-10 days. Return for xray today as planned - as we discussed, there is the possibility of small foreign body not felt or detected on your exam. Return to ER if worse, spreading redness, increased swelling, severe pain, pus, other concern. Follow up with hand surgeon in next couple days if any numbness, or problem with normal function of hand/digits.       Laceration Care, Adult A laceration is a cut or lesion that goes through all layers of the skin and into the tissue just beneath the skin. TREATMENT  Some lacerations may not require closure. Some lacerations may not be able to be closed due to an increased risk of infection. It is important to see your caregiver as soon as possible after an injury to minimize the risk of infection and maximize the opportunity for successful closure. If closure is appropriate, pain medicines may be given, if needed. The wound will be cleaned to help prevent infection. Your caregiver will use stitches (sutures), staples, wound glue (adhesive), or skin adhesive strips to repair the laceration. These tools bring the skin edges together to allow for faster healing and a better cosmetic outcome. However, all wounds will heal with a scar. Once the wound has healed, scarring can be minimized by covering the wound with sunscreen during the day for 1 full year. HOME CARE INSTRUCTIONS  For sutures or staples:  Keep the wound clean and dry.   If you were given a bandage (dressing), you should change it at least once a day. Also, change the dressing if it becomes wet or dirty, or as directed by your caregiver.   Wash the wound with soap and water 2 times a day. Rinse the wound off with water to remove all soap. Pat the wound dry with a clean towel.   After cleaning, apply a thin layer of the  antibiotic ointment as recommended by your caregiver. This will help prevent infection and keep the dressing from sticking.   You may shower as usual after the first 24 hours. Do not soak the wound in water until the sutures are removed.   Only take over-the-counter or prescription medicines for pain, discomfort, or fever as directed by your caregiver.   Get your sutures or staples removed as directed by your caregiver.  For skin adhesive strips:  Keep the wound clean and dry.   Do not get the skin adhesive strips wet. You may bathe carefully, using caution to keep the wound dry.   If the wound gets wet, pat it dry with a clean towel.   Skin adhesive strips will fall off on their own. You may trim the strips as the wound heals. Do not remove skin adhesive strips that are still stuck to the wound. They will fall off in time.  For wound adhesive:  You may briefly wet your wound in the shower or bath. Do not soak or scrub the wound. Do not swim. Avoid periods of heavy perspiration until the skin adhesive has fallen off on its own. After showering or bathing, gently pat the wound dry with a clean towel.   Do not apply liquid medicine, cream medicine, or ointment medicine to your wound while the skin adhesive is in place. This may loosen the film before your wound is healed.   If a dressing is placed over  the wound, be careful not to apply tape directly over the skin adhesive. This may cause the adhesive to be pulled off before the wound is healed.   Avoid prolonged exposure to sunlight or tanning lamps while the skin adhesive is in place. Exposure to ultraviolet light in the first year will darken the scar.   The skin adhesive will usually remain in place for 5 to 10 days, then naturally fall off the skin. Do not pick at the adhesive film.  You may need a tetanus shot if:  You cannot remember when you had your last tetanus shot.   You have never had a tetanus shot.  If you get a tetanus  shot, your arm may swell, get red, and feel warm to the touch. This is common and not a problem. If you need a tetanus shot and you choose not to have one, there is a rare chance of getting tetanus. Sickness from tetanus can be serious. SEEK MEDICAL CARE IF:   You have redness, swelling, or increasing pain in the wound.   You see a red line that goes away from the wound.   You have yellowish-white fluid (pus) coming from the wound.   You have a fever.   You notice a bad smell coming from the wound or dressing.   Your wound breaks open before or after sutures have been removed.   You notice something coming out of the wound such as wood or glass.   Your wound is on your hand or foot and you cannot move a finger or toe.  SEEK IMMEDIATE MEDICAL CARE IF:   Your pain is not controlled with prescribed medicine.   You have severe swelling around the wound causing pain and numbness or a change in color in your arm, hand, leg, or foot.   Your wound splits open and starts bleeding.   You have worsening numbness, weakness, or loss of function of any joint around or beyond the wound.   You develop painful lumps near the wound or on the skin anywhere on your body.  MAKE SURE YOU:   Understand these instructions.   Will watch your condition.   Will get help right away if you are not doing well or get worse.  Document Released: 08/04/2005 Document Revised: 07/24/2011 Document Reviewed: 01/28/2011 [facility] Patient Information 2012 [place], [place].
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
## 2012-01-13 NOTE — ED Provider Notes (Signed)
History  Scribed for Corey Roots, MD, the patient was seen in room STRE1/STRE1. This chart was scribed by Candelaria Stagers. The patient's care started at 11:30 AM    CSN: [id number]  Arrival date & time 01/13/12  1043   None     No chief complaint on file.    Laceration  The incident occurred less than 1 hour ago. Pain location: right dorsal wrist. The laceration is 3 cm in size. The laceration mechanism was a broken glass. The pain is mild. The pain has been constant since onset. He reports no foreign bodies present.   Corey Arnold is a 56 y.o. male who presents to the Emergency Department complaining of a laceration to the dorsal side of the right wrist that occurred about forty minutes ago on a piece of glass from a window.  Pt reports that he does not believe glass broke off into the wound.  Pt states that he also has a cut to the left hand.  Pt had the wound wrapped with gauze upon arrival.  Pt reports that it has been a while since last tetanus shot.       Past Medical History  Diagnosis Date  . Prostatitis   . Substance abuse   . Osteopenia   . ADHD (attention deficit hyperactivity disorder)   . History of colon polyps 09/2006  . Cervical radiculopathy at C7     Past Surgical History  Procedure Date  . Bcca 2002  . Total hip arthroplasty 2001  . Colostomy   . Inguinal hernia repair   . Cervical fusion     C4 - C6    Family History  Problem Relation Age of Onset  . Adopted: Yes  . Pulmonary fibrosis Father   . Stroke Mother     History  Substance Use Topics  . Smoking status: Former Smoker -- 1.0 packs/day for 40 years    Types: Cigarettes    Quit date: 09/02/2010  . Smokeless tobacco: Not on file  . Alcohol Use: Yes      Review of Systems  Skin: Positive for wound (laceration to the right wrist).    Allergies  Review of patient's allergies indicates no known allergies.  Home Medications   Current Outpatient Rx  Name Route Sig Dispense  Refill  . ACETAMINOPHEN-CODEINE #3 300-30 MG PO TABS  Take 1 tablet by mouth twice a day     . CALCIUM CARBONATE 600 MG PO TABS Oral Take 600 mg by mouth 2 (two) times daily with a meal.      . VITAMIN D 1000 UNITS PO CAPS Oral Take 1,000 Units by mouth daily.      Marland Kitchen DIAZEPAM 5 MG PO TABS  1 tablet by mouth two times a day     . OMEGA-3 FATTY ACIDS 1000 MG PO CAPS Oral Take 1 g by mouth daily.      Marland Kitchen HYDROCODONE-ACETAMINOPHEN 5-500 MG PO TABS  1-2  At bedtime     . ONE-DAILY MULTI VITAMINS PO TABS Oral Take 1 tablet by mouth daily.        BP 136/80  Pulse 63  Temp(Src) 98.6 F (37 C) (Oral)  Resp 20  SpO2 97%  Physical Exam  Nursing note and vitals reviewed. Constitutional: He is oriented to person, place, and time. He appears well-developed and well-nourished. No distress.  HENT:  Head: Normocephalic and atraumatic.  Eyes: Right eye exhibits no discharge. Left eye exhibits no discharge.  Musculoskeletal:  Normal rom wrist and digits. Radial pulse 2+. Normal tendon fxn.   Neurological: He is alert and oriented to person, place, and time.       Normal rom hand/digits. Motor/sens intact.   Skin: He is not diaphoretic.       3 cm avulsed area of skin on the dorsal side of the right wrist.   Psychiatric: He has a normal mood and affect. His behavior is normal. Judgment and thought content normal.    ED Course  Procedures  DIAGNOSTIC STUDIES: Oxygen Saturation is 97% on room air, normal by my interpretation.    COORDINATION OF CARE:  LACERATION REPAIR 12:01 PM   12:12 PM Ordered: DG Right Wrist Complete  12:24 PM Dermabond applied to the middle    MDM    Tetanus im.   LACERATION REPAIR Performed by: Corey Arnold Authorized by: Corey Arnold Consent: Verbal consent obtained. Risks and benefits: risks, benefits and alternatives were discussed Consent given by: patient Patient identity confirmed: provided demographic data Prepped and Draped in normal  sterile fashion Wound explored  Laceration Location: right wrist  Laceration Length:  3cm, flap  No Foreign Bodies seen or palpated  Anesthesia: local infiltration  Local anesthetic: lidocaine 2% wo epinephrine  Anesthetic total: 3 ml  Irrigation method: syringe Amount of cleaning: standard  Skin closure: 4-0 prolene  Number of sutures: 5  Technique: simple interruptted  Patient tolerance: Patient tolerated the procedure well with no immediate complications.    Temporary coban/pressure dressing to ensure to hematoma accumulation under flap.  Removed, sterile dressing.   Recheck radial pulse 2+. Hand nvi.  Discussed w pt after numbing wears off, if notes any numbness, loss of normal fxn, need to f/u hand.   Xray to rule out small fb not detected on exam.     Tiny few mm distal based flap lac tip of left middle finger. Wound cleaned w sterile saline. dermabond skin closure. Discussed w pt that small amt skin will avulse, but will use as biologic dressing/dermabond.    Discussed need for xray to rule out fb. Pt voices understand, but has board meeting now. States he will return today for xray. Discussed that if fb not found/xray not done, risk infection, need to reopen wound.  Pt understands, is not willing to stay now despite attempts to facilitate xray, states will return in next couple hours.      Corey Roots, MD 01/13/12 [phone]

## 2012-05-31 ENCOUNTER — Emergency Department (HOSPITAL_COMMUNITY)
Admission: EM | Admit: 2012-05-31 | Discharge: 2012-05-31 | Disposition: A | Discharge status: Home / Self Care | Payer: BC Managed Care – PPO | Attending: Emergency Medicine | Admitting: Emergency Medicine

## 2012-05-31 ENCOUNTER — Encounter (HOSPITAL_COMMUNITY)
Admission: EM | Disposition: A | Discharge status: Home / Self Care | Payer: Self-pay | Source: Home / Self Care | Attending: Emergency Medicine

## 2012-05-31 ENCOUNTER — Emergency Department (HOSPITAL_COMMUNITY): Payer: BC Managed Care – PPO | Admitting: Anesthesiology

## 2012-05-31 ENCOUNTER — Emergency Department (HOSPITAL_COMMUNITY): Payer: BC Managed Care – PPO

## 2012-05-31 ENCOUNTER — Encounter (HOSPITAL_COMMUNITY): Payer: Self-pay

## 2012-05-31 ENCOUNTER — Encounter (HOSPITAL_COMMUNITY): Payer: Self-pay | Admitting: Anesthesiology

## 2012-05-31 DIAGNOSIS — Z96649 Presence of unspecified artificial hip joint: Secondary | ICD-10-CM

## 2012-05-31 DIAGNOSIS — X500XXA Overexertion from strenuous movement or load, initial encounter: Secondary | ICD-10-CM | POA: Insufficient documentation

## 2012-05-31 DIAGNOSIS — M899 Disorder of bone, unspecified: Secondary | ICD-10-CM | POA: Insufficient documentation

## 2012-05-31 DIAGNOSIS — T84029A Dislocation of unspecified internal joint prosthesis, initial encounter: Secondary | ICD-10-CM | POA: Insufficient documentation

## 2012-05-31 DIAGNOSIS — Y9289 Other specified places as the place of occurrence of the external cause: Secondary | ICD-10-CM | POA: Insufficient documentation

## 2012-05-31 DIAGNOSIS — F909 Attention-deficit hyperactivity disorder, unspecified type: Secondary | ICD-10-CM | POA: Insufficient documentation

## 2012-05-31 DIAGNOSIS — Z79899 Other long term (current) drug therapy: Secondary | ICD-10-CM | POA: Insufficient documentation

## 2012-05-31 HISTORY — PX: HIP CLOSED REDUCTION: SHX983

## 2012-05-31 LAB — CBC
HCT: 39.2 % (ref 39.0–52.0)
Hemoglobin: 13.8 g/dL (ref 13.0–17.0)
MCH: 33.6 pg (ref 26.0–34.0)
MCV: 95.4 fL (ref 78.0–100.0)
WBC: 11.4 10*3/uL — ABNORMAL HIGH (ref 4.0–10.5)

## 2012-05-31 LAB — BASIC METABOLIC PANEL
CO2: 25 mEq/L (ref 19–32)
Calcium: 9.2 mg/dL (ref 8.4–10.5)
Creatinine, Ser: 0.58 mg/dL (ref 0.50–1.35)
GFR calc Af Amer: >90 mL/min (ref >90)
Glucose, Bld: 94 mg/dL (ref 70–99)
Potassium: 3.6 mEq/L (ref 3.5–5.1)
Sodium: 137 mEq/L (ref 135–145)

## 2012-05-31 SURGERY — CLOSED MANIPULATION, JOINT, HIP
Anesthesia: General | Site: Hip | Laterality: Left | Wound class: Clean

## 2012-05-31 MED ORDER — ONDANSETRON HCL 4 MG/2ML IJ SOLN
INTRAMUSCULAR | Status: DC | PRN
  Administered 2012-05-31: 4 mg via INTRAVENOUS

## 2012-05-31 MED ORDER — SODIUM CHLORIDE 0.9 % IV SOLN
INTRAVENOUS | Status: DC | PRN
  Administered 2012-05-31: 16:00:00 via INTRAVENOUS

## 2012-05-31 MED ORDER — SODIUM CHLORIDE 0.9 % IV SOLN
Freq: Once | INTRAVENOUS | Status: AC
  Administered 2012-05-31: 20 mL/h via INTRAVENOUS

## 2012-05-31 MED ORDER — ONDANSETRON HCL 4 MG/2ML IJ SOLN
4.0000 mg | Freq: Once | INTRAMUSCULAR | Status: AC
  Administered 2012-05-31: 4 mg via INTRAVENOUS
  Filled 2012-05-31: qty 2

## 2012-05-31 MED ORDER — HYDROCODONE-ACETAMINOPHEN 5-325 MG PO TABS
2.0000 | ORAL_TABLET | Freq: Four times a day (QID) | ORAL | Status: DC | PRN
  Administered 2012-05-31: 2 via ORAL
  Filled 2012-05-31: qty 2

## 2012-05-31 MED ORDER — SUCCINYLCHOLINE CHLORIDE 20 MG/ML IJ SOLN
INTRAMUSCULAR | Status: DC | PRN
  Administered 2012-05-31: 80 mg via INTRAVENOUS

## 2012-05-31 MED ORDER — HYDROMORPHONE HCL PF 1 MG/ML IJ SOLN
1.0000 mg | Freq: Once | INTRAMUSCULAR | Status: AC
  Administered 2012-05-31: 1 mg via INTRAVENOUS
  Filled 2012-05-31: qty 1

## 2012-05-31 MED ORDER — SODIUM CHLORIDE 0.9 % IV SOLN: INTRAVENOUS | Status: DC

## 2012-05-31 MED ORDER — PROPOFOL 10 MG/ML IV BOLUS
INTRAVENOUS | Status: DC | PRN
  Administered 2012-05-31: 150 mg via INTRAVENOUS

## 2012-05-31 MED ORDER — METHOCARBAMOL 500 MG PO TABS
500.0000 mg | ORAL_TABLET | Freq: Once | ORAL | Status: AC
  Administered 2012-05-31: 500 mg via ORAL
  Filled 2012-05-31: qty 1

## 2012-05-31 MED ORDER — MIDAZOLAM HCL 5 MG/5ML IJ SOLN
INTRAMUSCULAR | Status: DC | PRN
  Administered 2012-05-31: 2 mg via INTRAVENOUS

## 2012-05-31 MED ORDER — METHOCARBAMOL 500 MG PO TABS: 500.0000 mg | ORAL_TABLET | Freq: Four times a day (QID) | ORAL | Status: DC

## 2012-05-31 SURGICAL SUPPLY — 19 items
BANDAGE ADHESIVE 1X3 (GAUZE/BANDAGES/DRESSINGS) ×3 IMPLANT
CLOTH BEACON ORANGE TIMEOUT ST (SAFETY) ×2 IMPLANT
GLOVE BIOGEL PI IND STRL 8 (GLOVE) ×1 IMPLANT
GLOVE BIOGEL PI IND STRL 8.5 (GLOVE) ×1 IMPLANT
GLOVE BIOGEL PI INDICATOR 8 (GLOVE)
GLOVE BIOGEL PI INDICATOR 8.5 (GLOVE)
GLOVE ECLIPSE 8.0 STRL XLNG CF (GLOVE) ×2 IMPLANT
GOWN PREVENTION PLUS LG XLONG (DISPOSABLE) ×2 IMPLANT
GOWN STRL REIN XL XLG (GOWN DISPOSABLE) ×2 IMPLANT
IMMOBILIZER KNEE 20 (SOFTGOODS) ×2
IMMOBILIZER KNEE 20 THIGH 36 (SOFTGOODS) IMPLANT
MANIFOLD NEPTUNE II (INSTRUMENTS) ×1 IMPLANT
NDL SAFETY ECLIPSE 18X1.5 (NEEDLE) ×1 IMPLANT
NEEDLE HYPO 18GX1.5 SHARP (NEEDLE)
POSITIONER SURGICAL ARM (MISCELLANEOUS) ×1 IMPLANT
SOL PREP POV-IOD 16OZ 10% (MISCELLANEOUS) ×1 IMPLANT
SPONGE GAUZE 4X4 12PLY (GAUZE/BANDAGES/DRESSINGS) ×1 IMPLANT
SYR CONTROL 10ML LL (SYRINGE) ×1 IMPLANT
TOWEL OR 17X26 10 PK STRL BLUE (TOWEL DISPOSABLE) ×2 IMPLANT

## 2012-05-31 NOTE — Anesthesia Preprocedure Evaluation (Signed)
Anesthesia Evaluation  Patient identified by MRN, date of birth, ID band Patient awake    Reviewed: Allergy & Precautions, H&P , NPO status , Patient's Chart, lab work & pertinent test results  Airway Mallampati: II TM Distance: >3 FB Neck ROM: Full    Dental No notable dental hx.    Pulmonary COPDformer smoker,  breath sounds clear to auscultation  Pulmonary exam normal       Cardiovascular negative cardio ROS  Rhythm:Regular Rate:Normal     Neuro/Psych PSYCHIATRIC DISORDERS Cervical radiculopathy at C7 s/p fusion.  Neuromuscular disease    GI/Hepatic negative GI ROS, (+)     substance abuse   ,   Endo/Other  negative endocrine ROS  Renal/GU negative Renal ROS  negative genitourinary   Musculoskeletal negative musculoskeletal ROS (+)   Abdominal   Peds negative pediatric ROS (+) ADHD Hematology negative hematology ROS (+)   Anesthesia Other Findings   Reproductive/Obstetrics negative OB ROS                           Anesthesia Physical Anesthesia Plan  ASA: III  Anesthesia Plan: General   Post-op Pain Management:    Induction: Intravenous  Airway Management Planned: LMA  Additional Equipment:   Intra-op Plan:   Post-operative Plan: Extubation in OR  Informed Consent: I have reviewed the patients History and Physical, chart, labs and discussed the procedure including the risks, benefits and alternatives for the proposed anesthesia with the patient or authorized representative who has indicated his/her understanding and acceptance.   Dental advisory given  Plan Discussed with: CRNA  Anesthesia Plan Comments:         Anesthesia Quick Evaluation

## 2012-05-31 NOTE — Interval H&P Note (Signed)
History and Physical Interval Note:  05/31/2012 4:26 PM  Corey Arnold  has presented today for surgery, with the diagnosis of dislocated left hip  The various methods of treatment have been discussed with the patient and family. After consideration of risks, benefits and other options for treatment, the patient has consented to  Procedure(s) (LRB) with comments: CLOSED MANIPULATION HIP (Left) as a surgical intervention .  The patient's history has been reviewed, patient examined, no change in status, stable for surgery.  I have reviewed the patient's chart and labs.  Questions were answered to the patient's satisfaction.     Bari Handshoe A

## 2012-05-31 NOTE — Anesthesia Postprocedure Evaluation (Signed)
  Anesthesia Post-op Note  Patient: Corey Arnold  Procedure(s) Performed: Procedure(s) (LRB): CLOSED MANIPULATION HIP (Left)  Patient Location: PACU  Anesthesia Type: General  Level of Consciousness: awake and alert   Airway and Oxygen Therapy: Patient Spontanous Breathing  Post-op Pain: mild  Post-op Assessment: Post-op Vital signs reviewed, Patient's Cardiovascular Status Stable, Respiratory Function Stable, Patent Airway and No signs of Nausea or vomiting  Post-op Vital Signs: stable  Complications: No apparent anesthesia complications

## 2012-05-31 NOTE — ED Notes (Signed)
Pt reports trying to stand on stage while being pulled up by someone else. Sts L hip "popped out." Hx L total hip arthroplasty. Outward rotation noted. Pt taken to Res A.

## 2012-05-31 NOTE — Brief Op Note (Signed)
05/31/2012  4:44 PM  PATIENT:  Corey Arnold  56 y.o. male  PRE-OPERATIVE DIAGNOSIS:  dislocated left Total  hip  POST-OPERATIVE DIAGNOSIS:  dislocated left Total  hip  PROCEDURE:  Procedure(s) (LRB) with comments: CLOSED MANIPULATION HIP (Left)  SURGEON:  Surgeon(s) and Role:    * Jacki Cones, MD - Primary  PHYSICIAN ASSISTANT: Dimitri Ped PA  ASSISTANTS: Dimitri Ped PA  ANESTHESIA:   general  EBL:     BLOOD ADMINISTERED:none  DRAINS: none   LOCAL MEDICATIONS USED:  NONE  SPECIMEN:  No Specimen  DISPOSITION OF SPECIMEN:  N/A  COUNTS:  NO Not required on a closed Proceedure.  TOURNIQUET:  * No tourniquets in log *  DICTATION: .Other Dictation: Dictation Number [id number]  PLAN OF CARE: Discharge to home after PACU  PATIENT DISPOSITION:  PACU - hemodynamically stable.   Delay start of Pharmacological VTE agent (>24hrs) due to surgical blood loss or risk of bleeding: yes

## 2012-05-31 NOTE — Progress Notes (Signed)
Patient belongings from security and pharmacy returned to pt.

## 2012-05-31 NOTE — ED Notes (Signed)
Went in with security to collect pt's valuables and place in valuables envelope.  Pt requested to have his valuables with him at this time until "we know what's going on."  Pt already signed consent, prior to signing his consent this nurse asked pt if he had any questions, pt verbalized that he understood what they were doing, that he is going to the OR to have his L hip put back in place but is not going to be "cut open."  No valuables collected at this time.

## 2012-05-31 NOTE — H&P (Signed)
Corey Arnold is an 56 y.o. male.   Chief Complaint: left hip pain  HPI: The patient is a 56 year old male who presents with the chief complaint of left hip pain. He reports that yesterday around 10pm he was stepping onto a stage at a music festival. He reports that the stage was high so he had someone else hold his arm to help him up. He stepped up with his left hip, got about halfway up, and fell back due to the instability in his left hip. He had significant pain in the left hip and noticed that the foot was everting. He initially tried to walk on the left hip but had severe pain and says that his hip "felt as if walking on mush". He does report that he had "a few beers" but was not intoxicated. He did not present immediately to the ED because it was late. He presented this morning to the ED. Before the fall, he reports that the left total hip that was done but Dr. Lequita Halt 12 years ago was doing very well. He denies numbness and tingling in the legs. When he fell he also fell onto his left side, hitting his ribs. He has some pain on that side but denies chest pain and difficulty breathing.   Past Medical History  Diagnosis Date  . Prostatitis   . Substance abuse   . Osteopenia   . ADHD (attention deficit hyperactivity disorder)   . History of colon polyps 09/2006  . Cervical radiculopathy at C7     Past Surgical History  Procedure Date  . Bcca 2002  . Total hip arthroplasty 2001  . Colostomy   . Inguinal hernia repair   . Cervical fusion     C4 - C6  . Lumbar disc surgery     Family History  Problem Relation Age of Onset  . Adopted: Yes  . Pulmonary fibrosis Father   . Stroke Mother    Social History:  reports that he quit smoking about 20 months ago. His smoking use included Cigarettes. He has a 40 pack-year smoking history. He does not have any smokeless tobacco history on file. He reports that he drinks alcohol. He reports that he does not use illicit drugs.  Allergies:    Allergies  Allergen Reactions  . Latex Itching      Results for orders placed during the hospital encounter of 05/31/12 (from the past 48 hour(s))  CBC     Status: Abnormal   Collection Time   05/31/12 11:15 AM      Component Value Range Comment   WBC 11.4 (*) 4.0 - 10.5 K/uL    RBC 4.11 (*) 4.22 - 5.81 MIL/uL    Hemoglobin 13.8  13.0 - 17.0 g/dL    HCT 96.0  45.4 - 09.8 %    MCV 95.4  78.0 - 100.0 fL    MCH 33.6  26.0 - 34.0 pg    MCHC 35.2  30.0 - 36.0 g/dL    RDW 11.9  14.7 - 82.9 %    Platelets 228  150 - 400 K/uL   BASIC METABOLIC PANEL     Status: Normal   Collection Time   05/31/12 11:15 AM      Component Value Range Comment   Sodium 137  135 - 145 mEq/L    Potassium 3.6  3.5 - 5.1 mEq/L    Chloride 100  96 - 112 mEq/L    CO2 25  19 -  32 mEq/L    Glucose, Bld 94  70 - 99 mg/dL    BUN 10  6 - 23 mg/dL    Creatinine, Ser 0.45  0.50 - 1.35 mg/dL    Calcium 9.2  8.4 - 40.9 mg/dL    GFR calc non Af Amer >90  >90 mL/min    GFR calc Af Amer >90  >90 mL/min    Dg Ribs Unilateral W/chest Left  05/31/2012  *RADIOLOGY REPORT*  Clinical Data: Left-sided rib pain.  LEFT RIBS AND CHEST - 3+ VIEW  Comparison: None  Findings: The cardiac silhouette, mediastinal and hilar contours are within normal limits.  The lungs are clear.  No pneumothorax or pleural effusion.  The bony thorax is intact.  Dedicated views of the left ribs demonstrate no definite acute left-sided rib fractures.  IMPRESSION:  1.  No acute cardiopulmonary findings. 2.  No definite acute left-sided rib fractures.   Original Report Authenticated By: P. Loralie Champagne, M.D.    Dg Hip Complete Left  05/31/2012  *RADIOLOGY REPORT*  Clinical Data: Left hip pain.  LEFT HIP - COMPLETE 2+ VIEW  Comparison: None  Findings: The left femoral head prosthesis is dislocated superiorly and posteriorly.  No fractures identified.  IMPRESSION: Left femoral head prosthesis dislocation.   Original Report Authenticated By: P. Loralie Champagne, M.D.     Review of Systems  Constitutional: Negative.   HENT: Positive for neck pain.   Eyes: Negative.   Respiratory: Negative.   Cardiovascular: Negative.   Gastrointestinal: Negative.   Genitourinary: Negative.   Musculoskeletal: Positive for back pain, joint pain and falls. Negative for myalgias.       Left hip pain after a fall off a stage due to hip dislocation  Skin: Negative.   Neurological: Negative.   Endo/Heme/Allergies: Negative.   Psychiatric/Behavioral: Negative.     Blood pressure 113/95, pulse 64, temperature 98.5 F (36.9 C), temperature source Oral, resp. rate 18, SpO2 97.00%. Physical Exam  Constitutional: He is oriented to person, place, and time. He appears well-developed and well-nourished. No distress.  HENT:  Head: Normocephalic and atraumatic.  Right Ear: External ear normal.  Left Ear: External ear normal.  Nose: Nose normal.  Mouth/Throat: Oropharynx is clear and moist.  Eyes: Conjunctivae normal and EOM are normal.  Neck: Normal range of motion. Neck supple. No thyromegaly present.  Cardiovascular: Normal rate, regular rhythm, normal heart sounds and intact distal pulses.   No murmur heard. Respiratory: Effort normal. No respiratory distress. He has no wheezes. He has no rales. He exhibits tenderness.       Left sided rib tenderness  GI: Soft. Bowel sounds are normal. He exhibits no distension and no mass. There is no tenderness.  Musculoskeletal:       Right hip: Normal.       Left hip: He exhibits decreased range of motion, decreased strength and deformity. He exhibits no tenderness.       Right knee: Normal.       Left knee: Normal.       Right ankle: Normal.       Left ankle: Normal.       Legs: Lymphadenopathy:    He has no cervical adenopathy.  Neurological: He is alert and oriented to person, place, and time. He has normal reflexes. No sensory deficit.  Skin: No rash noted. He is not diaphoretic. No erythema.  Psychiatric:  He has a normal mood and affect.     Assessment/Plan Dislocated left  total hip. He needs to have the dislocated left total hip reduced. He has been NPO since last night around 6pm. He will remain that way. He reports that he is not on any blood thinners. He will be discharged home after.   Tehya Leath LAUREN 05/31/2012, 1:36 PM

## 2012-05-31 NOTE — ED Notes (Signed)
Pt's valuables given to security to be locked up.  Three prescriptions given to pharmacy for safe keeping.

## 2012-05-31 NOTE — Transfer of Care (Signed)
Immediate Anesthesia Transfer of Care Note  Patient: Corey Arnold  Procedure(s) Performed: Procedure(s) (LRB): CLOSED MANIPULATION HIP (Left)  Patient Location: PACU  Anesthesia Type: General  Level of Consciousness: sedated, patient cooperative and responds to stimulaton  Airway & Oxygen Therapy: Patient Spontanous Breathing and Patient connected to face mask oxgen  Post-op Assessment: Report given to PACU RN and Post -op Vital signs reviewed and stable  Post vital signs: Reviewed and stable  Complications: No apparent anesthesia complications

## 2012-05-31 NOTE — ED Provider Notes (Signed)
History     CSN: [id number]  Arrival date & time 05/31/12  1046   First MD Initiated Contact with Patient 05/31/12 1103      Chief Complaint  Patient presents with  . Hip Pain  . Hip Injury    (Consider location/radiation/quality/duration/timing/severity/associated sxs/prior treatment) HPI Pt with hx of left hip replacement presents with c/o feeling that his hip is dislocated.  He was at a music festival and was being helped up onto a stage by another person and then felt a pop.  He then fell onto his left side.  Denies striking head.  No LOC, no difficulty breathing.  Not able to bear weight on left leg and notes that his leg is rotated outward.  Movement and palpation makes pain worse.  Last po at approx 8:30am.  There are no other associated systemic symptoms, there are no other alleviating or modifying factors.   Past Medical History  Diagnosis Date  . Prostatitis   . Substance abuse   . Osteopenia   . ADHD (attention deficit hyperactivity disorder)   . History of colon polyps 09/2006  . Cervical radiculopathy at C7     Past Surgical History  Procedure Date  . Bcca 2002  . Total hip arthroplasty 2001  . Colostomy   . Inguinal hernia repair   . Cervical fusion     C4 - C6  . Lumbar disc surgery     Family History  Problem Relation Age of Onset  . Adopted: Yes  . Pulmonary fibrosis Father   . Stroke Mother     History  Substance Use Topics  . Smoking status: Former Smoker -- 1.0 packs/day for 40 years    Types: Cigarettes    Quit date: 09/02/2010  . Smokeless tobacco: Not on file  . Alcohol Use: Yes      Review of Systems ROS reviewed and all otherwise negative except for mentioned in HPI  Allergies  Latex  Home Medications   Current Outpatient Rx  Name Route Sig Dispense Refill  . CALCIUM CARBONATE 600 MG PO TABS Oral Take 600 mg by mouth daily.     Marland Kitchen VITAMIN D 1000 UNITS PO CAPS Oral Take 1,000 Units by mouth daily.      . DEXLANSOPRAZOLE 60  MG PO CPDR Oral Take 60 mg by mouth daily.    Marland Kitchen DIAZEPAM 5 MG PO TABS Oral Take 5 mg by mouth at bedtime as needed. For spasms    . OMEGA-3 FATTY ACIDS 1000 MG PO CAPS Oral Take 1 g by mouth daily.     Marland Kitchen HYDROCODONE-ACETAMINOPHEN 5-500 MG PO TABS Oral Take 1 tablet by mouth every 8 (eight) hours as needed. For pain    . ADULT MULTIVITAMIN W/MINERALS CH Oral Take 1 tablet by mouth daily.      BP 113/95  Pulse 64  Temp 98.5 F (36.9 C) (Oral)  Resp 18  SpO2 97%  Physical Exam Physical Examination: General appearance - alert, well appearing, and in no distress Mental status - alert, oriented to person, place, and time Eyes - no conjunctival injection, no scleral icterus Mouth - mucous membranes moist, pharynx normal without lesions Chest - clear to auscultation, no wheezes, rales or rhonchi, symmetric air entry, ttp over left chest wall at midaxillary line, no crepitus or stepoffs Heart - normal rate, regular rhythm, normal S1, S2, no murmurs, rubs, clicks or gallops Back exam - full range of motion, no tenderness, palpable spasm or pain on motion  Musculoskeletal - left lower extremity with shortening and external rotation, 2+ dp pulse, pain with minimal attempt at ROM otherwise no joint tenderness, deformity or swelling Extremities - peripheral pulses normal, no pedal edema, no clubbing or cyanosis Skin - normal coloration and turgor, no rashes Psych- anxious  ED Course  Procedures (including critical care time)  12:40 PM hip replacement is dislocated, d/w Dr. Despina Hick in OR, he recommends calling the on call physician for his group.    12:47 PM  D/w Dr. Myra Rude, ortho - he will see patient in the ED.    Labs Reviewed  CBC - Abnormal; Notable for the following:    WBC 11.4 (*)     RBC 4.11 (*)     All other components within normal limits  BASIC METABOLIC PANEL   Dg Ribs Unilateral W/chest Left  05/31/2012  *RADIOLOGY REPORT*  Clinical Data: Left-sided rib pain.  LEFT RIBS AND  CHEST - 3+ VIEW  Comparison: None  Findings: The cardiac silhouette, mediastinal and hilar contours are within normal limits.  The lungs are clear.  No pneumothorax or pleural effusion.  The bony thorax is intact.  Dedicated views of the left ribs demonstrate no definite acute left-sided rib fractures.  IMPRESSION:  1.  No acute cardiopulmonary findings. 2.  No definite acute left-sided rib fractures.   Original Report Authenticated By: P. Loralie Champagne, M.D.    Dg Hip Complete Left  05/31/2012  *RADIOLOGY REPORT*  Clinical Data: Left hip pain.  LEFT HIP - COMPLETE 2+ VIEW  Comparison: None  Findings: The left femoral head prosthesis is dislocated superiorly and posteriorly.  No fractures identified.  IMPRESSION: Left femoral head prosthesis dislocation.   Original Report Authenticated By: P. Loralie Champagne, M.D.      1. Dislocation of hip joint prosthesis       MDM  PT with left hip prosthesis dislocation, d/w ortho who is admitting patient.  No rib fracture or other injuries apparent due to fall.          Ethelda Chick, MD 05/31/12 [phone]

## 2012-06-01 ENCOUNTER — Encounter (HOSPITAL_COMMUNITY): Payer: Self-pay | Admitting: Orthopedic Surgery

## 2012-06-01 NOTE — Op Note (Signed)
NAMEBENTYN, CLABORN NO.:  [id number]  MEDICAL RECORD NO.:  [id number]  LOCATION:  [facility]                         FACILITY:  [facility]  PHYSICIAN:  Georges Lynch. Jammi Morrissette, M.D.DATE OF BIRTH:  1955/11/28  DATE OF PROCEDURE:  05/31/2012 DATE OF DISCHARGE:  05/31/2012                              OPERATIVE REPORT   SURGEON:  Windy Fast A. Darrelyn Hillock, MD  ASSISTANT:  Dimitri Ped, PA  PREOPERATIVE DIAGNOSIS:  Anterior dislocation of left total hip.  He had a previous total hip by Dr. Lequita Halt, 12 years ago, and not dislocated except for last night for the first time.  The mechanism was well described in his history and physical.  POSTOPERATIVE DIAGNOSIS:  Anterior dislocation of left total hip.  He had a previous total hip by Dr. Lequita Halt, 12 years ago, and not dislocated except for last night for the first time.  The mechanism was well described in his history and physical.  OPERATION:  Closed reduction of anterior superior dislocation of the left total hip.  PROCEDURE:  Under general anesthesia, routine time-out was carried out prior to carrying out any surgery.  I also marked the appropriate left leg in the holding area.  Once the patient was under general anesthesia and had good muscle relaxation, I did a closed manipulation of his left total hip that went in with ease.  At this time, an x-ray was taken in AP view that showed the hip was well located, there was no complicating factors.  I then placed him in a knee immobilizer.  He will be recovered up on crutches, partial weight with full weightbearing and he will be released.          ______________________________ Georges Lynch Darrelyn Hillock, M.D.     RAG/MEDQ  D:  05/31/2012  T:  06/01/2012  Job:  [id number]

## 2015-12-17 DIAGNOSIS — N401 Enlarged prostate with lower urinary tract symptoms: Secondary | ICD-10-CM | POA: Diagnosis not present

## 2015-12-17 DIAGNOSIS — R1013 Epigastric pain: Secondary | ICD-10-CM | POA: Diagnosis not present

## 2015-12-17 DIAGNOSIS — R634 Abnormal weight loss: Secondary | ICD-10-CM | POA: Diagnosis not present

## 2015-12-17 DIAGNOSIS — E784 Other hyperlipidemia: Secondary | ICD-10-CM | POA: Diagnosis not present

## 2015-12-17 DIAGNOSIS — M859 Disorder of bone density and structure, unspecified: Secondary | ICD-10-CM | POA: Diagnosis not present

## 2015-12-17 DIAGNOSIS — Z1389 Encounter for screening for other disorder: Secondary | ICD-10-CM | POA: Diagnosis not present

## 2015-12-17 DIAGNOSIS — Z125 Encounter for screening for malignant neoplasm of prostate: Secondary | ICD-10-CM | POA: Diagnosis not present

## 2015-12-20 DIAGNOSIS — R7301 Impaired fasting glucose: Secondary | ICD-10-CM | POA: Diagnosis not present

## 2016-01-10 DIAGNOSIS — R634 Abnormal weight loss: Secondary | ICD-10-CM | POA: Diagnosis not present

## 2016-01-10 DIAGNOSIS — K5730 Diverticulosis of large intestine without perforation or abscess without bleeding: Secondary | ICD-10-CM | POA: Diagnosis not present

## 2016-01-10 DIAGNOSIS — Z86010 Personal history of colonic polyps: Secondary | ICD-10-CM | POA: Diagnosis not present

## 2016-01-10 DIAGNOSIS — Z1211 Encounter for screening for malignant neoplasm of colon: Secondary | ICD-10-CM | POA: Diagnosis not present

## 2016-02-15 DIAGNOSIS — J449 Chronic obstructive pulmonary disease, unspecified: Secondary | ICD-10-CM | POA: Diagnosis not present

## 2016-02-15 DIAGNOSIS — R0781 Pleurodynia: Secondary | ICD-10-CM | POA: Diagnosis not present

## 2016-02-15 DIAGNOSIS — R05 Cough: Secondary | ICD-10-CM | POA: Diagnosis not present

## 2016-02-15 DIAGNOSIS — Z6822 Body mass index (BMI) 22.0-22.9, adult: Secondary | ICD-10-CM | POA: Diagnosis not present

## 2016-02-27 ENCOUNTER — Other Ambulatory Visit: Payer: Self-pay | Admitting: Gastroenterology

## 2016-02-27 DIAGNOSIS — D127 Benign neoplasm of rectosigmoid junction: Secondary | ICD-10-CM | POA: Diagnosis not present

## 2016-02-27 DIAGNOSIS — K2980 Duodenitis without bleeding: Secondary | ICD-10-CM | POA: Diagnosis not present

## 2016-02-27 DIAGNOSIS — K269 Duodenal ulcer, unspecified as acute or chronic, without hemorrhage or perforation: Secondary | ICD-10-CM | POA: Diagnosis not present

## 2016-02-27 DIAGNOSIS — Z1211 Encounter for screening for malignant neoplasm of colon: Secondary | ICD-10-CM | POA: Diagnosis not present

## 2016-02-27 DIAGNOSIS — K2970 Gastritis, unspecified, without bleeding: Secondary | ICD-10-CM | POA: Diagnosis not present

## 2016-02-27 DIAGNOSIS — K635 Polyp of colon: Secondary | ICD-10-CM | POA: Diagnosis not present

## 2016-02-27 DIAGNOSIS — R634 Abnormal weight loss: Secondary | ICD-10-CM

## 2016-02-27 DIAGNOSIS — K219 Gastro-esophageal reflux disease without esophagitis: Secondary | ICD-10-CM | POA: Diagnosis not present

## 2016-03-06 ENCOUNTER — Other Ambulatory Visit: Payer: Self-pay

## 2016-03-11 ENCOUNTER — Ambulatory Visit
Admission: RE | Admit: 2016-03-11 | Discharge: 2016-03-11 | Disposition: A | Discharge status: Home / Self Care | Payer: Self-pay | Source: Ambulatory Visit | Attending: Gastroenterology | Admitting: Gastroenterology

## 2016-03-11 DIAGNOSIS — R634 Abnormal weight loss: Secondary | ICD-10-CM

## 2016-03-11 MED ORDER — IOPAMIDOL (ISOVUE-300) INJECTION 61%
100.0000 mL | Freq: Once | INTRAVENOUS | Status: AC | PRN
  Administered 2016-03-11: 100 mL via INTRAVENOUS

## 2016-03-12 DIAGNOSIS — M47892 Other spondylosis, cervical region: Secondary | ICD-10-CM | POA: Diagnosis not present

## 2016-03-13 DIAGNOSIS — K5730 Diverticulosis of large intestine without perforation or abscess without bleeding: Secondary | ICD-10-CM | POA: Diagnosis not present

## 2016-03-13 DIAGNOSIS — K219 Gastro-esophageal reflux disease without esophagitis: Secondary | ICD-10-CM | POA: Diagnosis not present

## 2016-04-09 DIAGNOSIS — H6123 Impacted cerumen, bilateral: Secondary | ICD-10-CM | POA: Diagnosis not present

## 2016-04-23 DIAGNOSIS — E784 Other hyperlipidemia: Secondary | ICD-10-CM | POA: Diagnosis not present

## 2016-04-23 DIAGNOSIS — N401 Enlarged prostate with lower urinary tract symptoms: Secondary | ICD-10-CM | POA: Diagnosis not present

## 2016-04-23 DIAGNOSIS — M859 Disorder of bone density and structure, unspecified: Secondary | ICD-10-CM | POA: Diagnosis not present

## 2016-04-23 DIAGNOSIS — R7301 Impaired fasting glucose: Secondary | ICD-10-CM | POA: Diagnosis not present

## 2016-05-15 DIAGNOSIS — M47892 Other spondylosis, cervical region: Secondary | ICD-10-CM | POA: Diagnosis not present

## 2016-08-05 DIAGNOSIS — R1084 Generalized abdominal pain: Secondary | ICD-10-CM | POA: Diagnosis not present

## 2016-08-05 DIAGNOSIS — K529 Noninfective gastroenteritis and colitis, unspecified: Secondary | ICD-10-CM | POA: Diagnosis not present

## 2016-08-05 DIAGNOSIS — J449 Chronic obstructive pulmonary disease, unspecified: Secondary | ICD-10-CM | POA: Diagnosis not present

## 2016-08-05 DIAGNOSIS — Z6822 Body mass index (BMI) 22.0-22.9, adult: Secondary | ICD-10-CM | POA: Diagnosis not present

## 2016-08-06 DIAGNOSIS — D538 Other specified nutritional anemias: Secondary | ICD-10-CM | POA: Diagnosis not present

## 2016-10-07 DIAGNOSIS — L570 Actinic keratosis: Secondary | ICD-10-CM | POA: Diagnosis not present

## 2016-10-07 DIAGNOSIS — D225 Melanocytic nevi of trunk: Secondary | ICD-10-CM | POA: Diagnosis not present

## 2016-10-07 DIAGNOSIS — D2271 Melanocytic nevi of right lower limb, including hip: Secondary | ICD-10-CM | POA: Diagnosis not present

## 2016-10-07 DIAGNOSIS — D2261 Melanocytic nevi of right upper limb, including shoulder: Secondary | ICD-10-CM | POA: Diagnosis not present

## 2016-10-07 DIAGNOSIS — D2272 Melanocytic nevi of left lower limb, including hip: Secondary | ICD-10-CM | POA: Diagnosis not present

## 2016-10-30 DIAGNOSIS — E784 Other hyperlipidemia: Secondary | ICD-10-CM | POA: Diagnosis not present

## 2016-10-30 DIAGNOSIS — Z1389 Encounter for screening for other disorder: Secondary | ICD-10-CM | POA: Diagnosis not present

## 2016-10-30 DIAGNOSIS — J449 Chronic obstructive pulmonary disease, unspecified: Secondary | ICD-10-CM | POA: Diagnosis not present

## 2016-10-30 DIAGNOSIS — Z6821 Body mass index (BMI) 21.0-21.9, adult: Secondary | ICD-10-CM | POA: Diagnosis not present

## 2016-10-30 DIAGNOSIS — R7301 Impaired fasting glucose: Secondary | ICD-10-CM | POA: Diagnosis not present

## 2016-10-30 DIAGNOSIS — N401 Enlarged prostate with lower urinary tract symptoms: Secondary | ICD-10-CM | POA: Diagnosis not present

## 2016-10-30 DIAGNOSIS — R634 Abnormal weight loss: Secondary | ICD-10-CM | POA: Diagnosis not present

## 2016-10-30 DIAGNOSIS — D539 Nutritional anemia, unspecified: Secondary | ICD-10-CM | POA: Diagnosis not present

## 2016-10-30 DIAGNOSIS — M859 Disorder of bone density and structure, unspecified: Secondary | ICD-10-CM | POA: Diagnosis not present

## 2017-01-28 DIAGNOSIS — D126 Benign neoplasm of colon, unspecified: Secondary | ICD-10-CM | POA: Diagnosis not present

## 2017-01-28 DIAGNOSIS — N401 Enlarged prostate with lower urinary tract symptoms: Secondary | ICD-10-CM | POA: Diagnosis not present

## 2017-01-28 DIAGNOSIS — Z6820 Body mass index (BMI) 20.0-20.9, adult: Secondary | ICD-10-CM | POA: Diagnosis not present

## 2017-01-28 DIAGNOSIS — R7301 Impaired fasting glucose: Secondary | ICD-10-CM | POA: Diagnosis not present

## 2017-01-28 DIAGNOSIS — J449 Chronic obstructive pulmonary disease, unspecified: Secondary | ICD-10-CM | POA: Diagnosis not present

## 2017-01-28 DIAGNOSIS — R740 Nonspecific elevation of levels of transaminase and lactic acid dehydrogenase [LDH]: Secondary | ICD-10-CM | POA: Diagnosis not present

## 2017-01-28 DIAGNOSIS — D539 Nutritional anemia, unspecified: Secondary | ICD-10-CM | POA: Diagnosis not present

## 2017-01-28 DIAGNOSIS — Z1389 Encounter for screening for other disorder: Secondary | ICD-10-CM | POA: Diagnosis not present

## 2017-01-28 DIAGNOSIS — R634 Abnormal weight loss: Secondary | ICD-10-CM | POA: Diagnosis not present

## 2017-02-19 DIAGNOSIS — Z86010 Personal history of colonic polyps: Secondary | ICD-10-CM | POA: Diagnosis not present

## 2017-02-19 DIAGNOSIS — R634 Abnormal weight loss: Secondary | ICD-10-CM | POA: Diagnosis not present

## 2017-09-03 DIAGNOSIS — K5730 Diverticulosis of large intestine without perforation or abscess without bleeding: Secondary | ICD-10-CM | POA: Diagnosis not present

## 2017-09-03 DIAGNOSIS — Z86010 Personal history of colonic polyps: Secondary | ICD-10-CM | POA: Diagnosis not present

## 2018-01-21 DIAGNOSIS — G5603 Carpal tunnel syndrome, bilateral upper limbs: Secondary | ICD-10-CM | POA: Diagnosis not present

## 2018-01-21 DIAGNOSIS — M47896 Other spondylosis, lumbar region: Secondary | ICD-10-CM | POA: Diagnosis not present

## 2018-01-21 DIAGNOSIS — M47892 Other spondylosis, cervical region: Secondary | ICD-10-CM | POA: Diagnosis not present

## 2018-01-22 DIAGNOSIS — F3289 Other specified depressive episodes: Secondary | ICD-10-CM | POA: Diagnosis not present

## 2018-01-22 DIAGNOSIS — F17200 Nicotine dependence, unspecified, uncomplicated: Secondary | ICD-10-CM | POA: Diagnosis not present

## 2018-01-22 DIAGNOSIS — R634 Abnormal weight loss: Secondary | ICD-10-CM | POA: Diagnosis not present

## 2018-01-22 DIAGNOSIS — K4090 Unilateral inguinal hernia, without obstruction or gangrene, not specified as recurrent: Secondary | ICD-10-CM | POA: Diagnosis not present

## 2018-02-15 ENCOUNTER — Other Ambulatory Visit: Payer: Self-pay | Admitting: Surgery

## 2018-02-15 ENCOUNTER — Ambulatory Visit: Payer: Self-pay | Admitting: Surgery

## 2018-02-15 DIAGNOSIS — R109 Unspecified abdominal pain: Secondary | ICD-10-CM | POA: Diagnosis not present

## 2018-02-15 DIAGNOSIS — K4090 Unilateral inguinal hernia, without obstruction or gangrene, not specified as recurrent: Secondary | ICD-10-CM | POA: Diagnosis not present

## 2018-02-15 DIAGNOSIS — R1011 Right upper quadrant pain: Secondary | ICD-10-CM

## 2018-02-23 ENCOUNTER — Ambulatory Visit
Admission: RE | Admit: 2018-02-23 | Discharge: 2018-02-23 | Disposition: A | Discharge status: Home / Self Care | Payer: No Typology Code available for payment source | Source: Ambulatory Visit | Attending: Surgery | Admitting: Surgery

## 2018-02-23 DIAGNOSIS — R1011 Right upper quadrant pain: Secondary | ICD-10-CM

## 2018-02-25 ENCOUNTER — Other Ambulatory Visit: Payer: Self-pay | Admitting: Surgery

## 2018-02-25 DIAGNOSIS — R1011 Right upper quadrant pain: Secondary | ICD-10-CM

## 2018-03-01 ENCOUNTER — Other Ambulatory Visit: Payer: Self-pay | Admitting: Surgery

## 2018-03-01 DIAGNOSIS — K4090 Unilateral inguinal hernia, without obstruction or gangrene, not specified as recurrent: Secondary | ICD-10-CM

## 2018-03-01 DIAGNOSIS — R109 Unspecified abdominal pain: Secondary | ICD-10-CM

## 2018-03-01 DIAGNOSIS — R1011 Right upper quadrant pain: Secondary | ICD-10-CM

## 2018-03-03 ENCOUNTER — Other Ambulatory Visit: Payer: Self-pay | Admitting: Surgery

## 2018-03-03 DIAGNOSIS — R1011 Right upper quadrant pain: Secondary | ICD-10-CM

## 2018-03-03 DIAGNOSIS — K4090 Unilateral inguinal hernia, without obstruction or gangrene, not specified as recurrent: Secondary | ICD-10-CM

## 2018-03-03 DIAGNOSIS — R109 Unspecified abdominal pain: Secondary | ICD-10-CM

## 2018-03-04 ENCOUNTER — Ambulatory Visit (HOSPITAL_COMMUNITY)
Admission: RE | Admit: 2018-03-04 | Discharge: 2018-03-04 | Disposition: A | Discharge status: Home / Self Care | Payer: BLUE CROSS/BLUE SHIELD | Source: Ambulatory Visit | Attending: Surgery | Admitting: Surgery

## 2018-03-04 DIAGNOSIS — K760 Fatty (change of) liver, not elsewhere classified: Secondary | ICD-10-CM | POA: Diagnosis not present

## 2018-03-04 DIAGNOSIS — R109 Unspecified abdominal pain: Secondary | ICD-10-CM | POA: Insufficient documentation

## 2018-03-04 DIAGNOSIS — K4090 Unilateral inguinal hernia, without obstruction or gangrene, not specified as recurrent: Secondary | ICD-10-CM | POA: Diagnosis not present

## 2018-03-04 DIAGNOSIS — K5730 Diverticulosis of large intestine without perforation or abscess without bleeding: Secondary | ICD-10-CM | POA: Diagnosis not present

## 2018-03-04 DIAGNOSIS — N400 Enlarged prostate without lower urinary tract symptoms: Secondary | ICD-10-CM | POA: Insufficient documentation

## 2018-03-04 DIAGNOSIS — R1011 Right upper quadrant pain: Secondary | ICD-10-CM | POA: Diagnosis not present

## 2018-03-04 LAB — POCT I-STAT CREATININE: Creatinine, Ser: 0.6 mg/dL — ABNORMAL LOW (ref 0.61–1.24)

## 2018-03-04 MED ORDER — IOPAMIDOL (ISOVUE-300) INJECTION 61%
INTRAVENOUS | Status: AC
  Filled 2018-03-04: qty 100

## 2018-03-04 MED ORDER — IOPAMIDOL (ISOVUE-300) INJECTION 61%
100.0000 mL | Freq: Once | INTRAVENOUS | Status: AC | PRN
  Administered 2018-03-04: 100 mL via INTRAVENOUS

## 2018-03-05 ENCOUNTER — Encounter (HOSPITAL_COMMUNITY): Payer: BLUE CROSS/BLUE SHIELD

## 2018-03-05 NOTE — Progress Notes (Signed)
Please call the patient and let them know that their scan did not show any serious abnormalities.  We can see the left inguinal hernia.  No colon issues seen.  We will wait for his HIDA scan.

## 2018-03-12 ENCOUNTER — Ambulatory Visit: Payer: Self-pay | Admitting: Surgery

## 2018-03-18 ENCOUNTER — Encounter (HOSPITAL_COMMUNITY): Payer: Self-pay

## 2018-03-18 ENCOUNTER — Ambulatory Visit (HOSPITAL_COMMUNITY)
Admission: RE | Admit: 2018-03-18 | Discharge: 2018-03-18 | Disposition: A | Discharge status: Home / Self Care | Payer: BLUE CROSS/BLUE SHIELD | Source: Ambulatory Visit | Attending: Surgery | Admitting: Surgery

## 2018-03-18 DIAGNOSIS — R1011 Right upper quadrant pain: Secondary | ICD-10-CM

## 2018-03-19 ENCOUNTER — Ambulatory Visit (HOSPITAL_COMMUNITY)
Admission: RE | Admit: 2018-03-19 | Discharge: 2018-03-19 | Disposition: A | Discharge status: Home / Self Care | Payer: BLUE CROSS/BLUE SHIELD | Source: Ambulatory Visit | Attending: Surgery | Admitting: Surgery

## 2018-03-19 DIAGNOSIS — R1011 Right upper quadrant pain: Secondary | ICD-10-CM | POA: Diagnosis not present

## 2018-03-19 MED ORDER — TECHNETIUM TC 99M MEBROFENIN IV KIT
5.0000 | PACK | Freq: Once | INTRAVENOUS | Status: AC | PRN
  Administered 2018-03-19: 5 via INTRAVENOUS

## 2018-03-22 ENCOUNTER — Ambulatory Visit: Payer: Self-pay | Admitting: Surgery

## 2018-03-22 NOTE — Progress Notes (Signed)
Please call the patient and let them know that their HIDA scan showed normal function of the gallbladder.  We can proceed with his hernia surgery.

## 2018-03-23 DIAGNOSIS — K4090 Unilateral inguinal hernia, without obstruction or gangrene, not specified as recurrent: Secondary | ICD-10-CM | POA: Diagnosis not present

## 2018-03-23 DIAGNOSIS — G8918 Other acute postprocedural pain: Secondary | ICD-10-CM | POA: Diagnosis not present

## 2018-04-01 DIAGNOSIS — R194 Change in bowel habit: Secondary | ICD-10-CM | POA: Diagnosis not present

## 2018-04-01 DIAGNOSIS — R1011 Right upper quadrant pain: Secondary | ICD-10-CM | POA: Diagnosis not present

## 2018-04-01 DIAGNOSIS — K5730 Diverticulosis of large intestine without perforation or abscess without bleeding: Secondary | ICD-10-CM | POA: Diagnosis not present

## 2018-04-06 ENCOUNTER — Other Ambulatory Visit: Payer: Self-pay | Admitting: Gastroenterology

## 2018-04-06 DIAGNOSIS — R1011 Right upper quadrant pain: Secondary | ICD-10-CM

## 2018-04-28 ENCOUNTER — Emergency Department (HOSPITAL_COMMUNITY)
Admission: EM | Admit: 2018-04-28 | Discharge: 2018-04-28 | Disposition: A | Discharge status: Home / Self Care | Payer: BLUE CROSS/BLUE SHIELD | Attending: Emergency Medicine | Admitting: Emergency Medicine

## 2018-04-28 ENCOUNTER — Other Ambulatory Visit: Payer: Self-pay

## 2018-04-28 ENCOUNTER — Encounter (HOSPITAL_COMMUNITY): Payer: Self-pay | Admitting: Emergency Medicine

## 2018-04-28 ENCOUNTER — Emergency Department (HOSPITAL_COMMUNITY): Payer: BLUE CROSS/BLUE SHIELD

## 2018-04-28 DIAGNOSIS — M79641 Pain in right hand: Secondary | ICD-10-CM | POA: Diagnosis not present

## 2018-04-28 DIAGNOSIS — Z87891 Personal history of nicotine dependence: Secondary | ICD-10-CM | POA: Insufficient documentation

## 2018-04-28 DIAGNOSIS — L0390 Cellulitis, unspecified: Secondary | ICD-10-CM

## 2018-04-28 DIAGNOSIS — L03113 Cellulitis of right upper limb: Secondary | ICD-10-CM | POA: Insufficient documentation

## 2018-04-28 DIAGNOSIS — Z23 Encounter for immunization: Secondary | ICD-10-CM | POA: Insufficient documentation

## 2018-04-28 DIAGNOSIS — J449 Chronic obstructive pulmonary disease, unspecified: Secondary | ICD-10-CM | POA: Insufficient documentation

## 2018-04-28 DIAGNOSIS — Z91040 Latex allergy status: Secondary | ICD-10-CM | POA: Insufficient documentation

## 2018-04-28 DIAGNOSIS — M7989 Other specified soft tissue disorders: Secondary | ICD-10-CM | POA: Diagnosis not present

## 2018-04-28 DIAGNOSIS — Z79899 Other long term (current) drug therapy: Secondary | ICD-10-CM | POA: Insufficient documentation

## 2018-04-28 LAB — BASIC METABOLIC PANEL
Anion gap: 14 (ref 5–15)
BUN: 11 mg/dL (ref 6–20)
CHLORIDE: 98 mmol/L (ref 98–111)
CO2: 24 mmol/L (ref 22–32)
CREATININE: 0.5 mg/dL — AB (ref 0.61–1.24)
Calcium: 9.6 mg/dL (ref 8.9–10.3)
GFR calc Af Amer: >60 mL/min (ref >60)
GFR calc non Af Amer: >60 mL/min (ref >60)
Glucose, Bld: 159 mg/dL — ABNORMAL HIGH (ref 70–99)
Potassium: 3.3 mmol/L — ABNORMAL LOW (ref 3.5–5.1)
SODIUM: 136 mmol/L (ref 135–145)

## 2018-04-28 LAB — CBC WITH DIFFERENTIAL/PLATELET
Abs Immature Granulocytes: 0 10*3/uL (ref 0.0–0.1)
BASOS ABS: 0.1 10*3/uL (ref 0.0–0.1)
BASOS PCT: 1 %
EOS ABS: 0.1 10*3/uL (ref 0.0–0.7)
Eosinophils Relative: 1 %
HCT: 38.9 % — ABNORMAL LOW (ref 39.0–52.0)
HEMOGLOBIN: 13.1 g/dL (ref 13.0–17.0)
Immature Granulocytes: 0 %
Lymphocytes Relative: 15 %
Lymphs Abs: 1.2 10*3/uL (ref 0.7–4.0)
MCH: 34.7 pg — AB (ref 26.0–34.0)
MCHC: 33.7 g/dL (ref 30.0–36.0)
MCV: 102.9 fL — ABNORMAL HIGH (ref 78.0–100.0)
MONO ABS: 1 10*3/uL (ref 0.1–1.0)
Monocytes Relative: 12 %
Neutro Abs: 5.8 10*3/uL (ref 1.7–7.7)
Neutrophils Relative %: 71 %
PLATELETS: 239 10*3/uL (ref 150–400)
RBC: 3.78 MIL/uL — AB (ref 4.22–5.81)
RDW: 12.1 % (ref 11.5–15.5)
WBC: 8.2 10*3/uL (ref 4.0–10.5)

## 2018-04-28 MED ORDER — TETANUS-DIPHTH-ACELL PERTUSSIS 5-2.5-18.5 LF-MCG/0.5 IM SUSP
0.5000 mL | Freq: Once | INTRAMUSCULAR | Status: AC
  Administered 2018-04-28: 0.5 mL via INTRAMUSCULAR
  Filled 2018-04-28: qty 0.5

## 2018-04-28 MED ORDER — DOXYCYCLINE HYCLATE 100 MG PO TABS
100.0000 mg | ORAL_TABLET | Freq: Once | ORAL | Status: AC
  Administered 2018-04-28: 100 mg via ORAL
  Filled 2018-04-28: qty 1

## 2018-04-28 MED ORDER — ACETAMINOPHEN 325 MG PO TABS
650.0000 mg | ORAL_TABLET | Freq: Once | ORAL | Status: AC
  Administered 2018-04-28: 650 mg via ORAL
  Filled 2018-04-28: qty 2

## 2018-04-28 MED ORDER — DOXYCYCLINE HYCLATE 100 MG PO CAPS: 100.0000 mg | ORAL_CAPSULE | Freq: Two times a day (BID) | ORAL | 0 refills | Status: AC

## 2018-04-28 NOTE — ED Provider Notes (Signed)
Patient placed in Quick Look pathway, seen and evaluated   Chief Complaint: hand infection and FB hand  HPI:   Corey Arnold is a 62 y.o. male who presents to the ED for a hand infection.  Pt reports last week he dropped a glass jar of peanut butter and it broke and glass went in his hand. Pt reports having a scrape to to R hand and pt attempted to clean out glass. Pt reports waking up Sunday with swelling to R hand. Pt has redness and swelling to R hand.   ROS: skin: swelling and redness right hand  Physical Exam:  BP (!) 164/91 (BP Location: Left Arm)   Pulse 77   Temp 98.5 F (36.9 C) (Oral)   Resp 18   Ht 5\' 9"  (1.753 m)   SpO2 96%   BMI 25.52 kg/m    Gen: No distress  Neuro: Awake and Alert  Skin: right hand with erythema, swelling and tenderness, wound to the dorsum of the hand noted, ? FB    Initiation of care has begun. The patient has been counseled on the process, plan, and necessity for staying for the completion/evaluation, and the remainder of the medical screening examination    Ashley Murrain, NP 04/28/18 [place]    Tegeler, Gwenyth Allegra, MD 04/28/18 [phone]

## 2018-04-28 NOTE — ED Notes (Signed)
Patient and family updated on delays and wait time

## 2018-04-28 NOTE — ED Notes (Addendum)
While giving patient Tylenol patient began to argue with this RN about why he is having to wait so long.  Patient stated he was told an hour ago that he has four people ahead of him.  This RN made patient aware of no movement, and patient still has four.  This RN encouraged patient not to leave due to his cellulitis being in his hand and the need to protect this vital appendage.  Patient began to tell this RN how we do not care about the patients in the lobby and we "obviously don't think its a priority".  This RN apologized and attempted to explain delays.  Patient demanded an exact time before he will be seen.    This RN stated she was not able to get one but would try to work on it.  Patient snatched stickers from this RN and stormed out to the lobby.

## 2018-04-28 NOTE — ED Triage Notes (Signed)
Pt reports last Tuesday he dropped a glass jar of peanut butter. Pt reports having a scrape to to R hand and pt attempted to clean out glass. Pt reports waking up Sunday with swelling to R hand. Pt has redness and swelling to R hand.

## 2018-04-28 NOTE — ED Notes (Signed)
Results reviewed.  No changes in acuity at this time 

## 2018-04-28 NOTE — Discharge Instructions (Addendum)
You were given a prescription for antibiotics. Please take the antibiotic prescription fully.   Please call your hand surgeon, Dr. Amedeo Plenty tomorrow to schedule an appointment for follow-up.  Please return to the emergency room immediately if you experience any new or worsening symptoms or any symptoms that indicate worsening infection such as fevers, increased redness/swelling/pain, warmth, or drainage from the affected area.

## 2018-04-28 NOTE — ED Notes (Signed)
Pt. Left room before signing discharge paperwork.

## 2018-04-28 NOTE — ED Provider Notes (Signed)
[facility] EMERGENCY DEPARTMENT Provider Note   CSN: [id number] Arrival date & time: 04/28/18  1535     History   Chief Complaint Chief Complaint  Patient presents with  . Cellulitis    HPI Corey Arnold is a 62 y.o. male.  HPI   Pt is a 62 y/o male with a h/o ADHD, cervical radiculopathy, osteopenia, substance abuse, COPD who presents to the ED today c/o right hand pain and swelling that began 4 days ago. Pt states he was cut by glass 1.5 weeks ago. States that today he was pressing on his hand and he noted multiple piece of glass came out of his wound.  He thought that there was a long piece of glass still stuck in his hand which concerned him and brought him to the emergency department today.  Denies any fevers or chills.  States he put hydrogen peroxide on the wound with no significant relief.  Denies any other injuries.  Has chronic numbness/tingling to the fingers due to cervical spine radiculopathy and history of carpal tunnel syndrome that is unchanged today.  Describes pain is mild.  Worse with palpation. Pt reports that he has seen Dr. Amedeo Plenty in the past.  Past Medical History:  Diagnosis Date  . ADHD (attention deficit hyperactivity disorder)   . Cervical radiculopathy at C7   . History of colon polyps 09/2006  . Osteopenia   . Prostatitis   . Substance abuse [facility])     Patient Active Problem List   Diagnosis Date Noted  . Cough 11/06/2010  . COPD (chronic obstructive pulmonary disease) ([place]) 11/06/2010  . Prostatitis   . Substance abuse ([place])   . Osteopenia   . History of colon polyps   . C7 radiculopathy     Past Surgical History:  Procedure Laterality Date  . BCCA  2002  . CERVICAL FUSION     C4 - C6  . COLOSTOMY    . HIP CLOSED REDUCTION  05/31/2012   Procedure: CLOSED MANIPULATION HIP;  Surgeon: Tobi Bastos, MD;  Location: [facility] ORS;  Service: Orthopedics;  Laterality: Left;  . INGUINAL HERNIA REPAIR    . LUMBAR DISC SURGERY      . TOTAL HIP ARTHROPLASTY  2001        Home Medications    Prior to Admission medications   Medication Sig Start Date End Date Taking? Authorizing Provider  calcium carbonate (OS-CAL) 600 MG TABS Take 600 mg by mouth daily.     [provider]  Cholecalciferol (VITAMIN D) 1000 UNITS capsule Take 1,000 Units by mouth daily.      [provider]  dexlansoprazole (DEXILANT) 60 MG capsule Take 60 mg by mouth daily.    [provider]  diazepam (VALIUM) 5 MG tablet Take 5 mg by mouth at bedtime as needed. For spasms    [provider]  doxycycline (VIBRAMYCIN) 100 MG capsule Take 1 capsule (100 mg total) by mouth 2 (two) times daily for 7 days. 04/28/18 05/05/18  Garima Chronis S, PA-C  fish oil-omega-3 fatty acids 1000 MG capsule Take 1 g by mouth daily.     [provider]  HYDROcodone-acetaminophen (VICODIN) 5-500 MG per tablet Take 1 tablet by mouth every 8 (eight) hours as needed. For pain    [provider]  methocarbamol (ROBAXIN) 500 MG tablet Take 1 tablet (500 mg total) by mouth 4 (four) times daily. 05/31/12   Latanya Maudlin, MD  Multiple Vitamin (MULTIVITAMIN WITH  MINERALS) TABS Take 1 tablet by mouth daily.    [provider]    Family History Family History  Adopted: Yes  Problem Relation Age of Onset  . Pulmonary fibrosis Father   . Stroke Mother     Social History Social History   Tobacco Use  . Smoking status: Former Smoker    Packs/day: 1.00    Years: 40.00    Pack years: 40.00    Types: Cigarettes    Last attempt to quit: 09/02/2010    Years since quitting: 7.6  Substance Use Topics  . Alcohol use: Yes  . Drug use: No     Allergies   Latex   Review of Systems Review of Systems  Constitutional: Negative for chills and fever.  HENT: Negative for congestion.   Eyes: Negative for visual disturbance.  Respiratory: Negative for shortness of breath.   Cardiovascular: Negative for chest pain.   Gastrointestinal: Negative for abdominal pain and diarrhea.  Genitourinary: Negative for flank pain.  Musculoskeletal:       Right hand pain  Skin: Positive for color change and wound.  Neurological: Positive for numbness (chronic, unchanged).     Physical Exam Updated Vital Signs BP (!) 137/109   Pulse 77   Temp 98.5 F (36.9 C) (Oral)   Resp 18   Ht 5\' 9"  (1.753 m)   SpO2 99%   BMI 25.52 kg/m   Physical Exam  Constitutional: He is oriented to person, place, and time. He appears well-developed and well-nourished. No distress.  HENT:  Head: Normocephalic and atraumatic.  Eyes: Conjunctivae are normal. No scleral icterus.  Neck: Neck supple.  Cardiovascular: Normal rate.  Pulmonary/Chest: Effort normal.  Abdominal: Soft. There is no tenderness.  Musculoskeletal: Normal range of motion.  TTP and erythema to the right dorsal hand with scab in place. 5/5 grip strength bilaterally. Grossly NVI, reports chronic tingling to fingertips (unchanged). Wound pictured below.  Neurological: He is alert and oriented to person, place, and time.  Skin: Skin is warm and dry. Capillary refill takes less than 2 seconds.  Psychiatric: He has a normal mood and affect.        ED Treatments / Results  Labs (all labs ordered are listed, but only abnormal results are displayed) Labs Reviewed  CBC WITH DIFFERENTIAL/PLATELET - Abnormal; Notable for the following components:      Result Value   RBC 3.78 (*)    HCT 38.9 (*)    MCV 102.9 (*)    MCH 34.7 (*)    All other components within normal limits  BASIC METABOLIC PANEL - Abnormal; Notable for the following components:   Potassium 3.3 (*)    Glucose, Bld 159 (*)    Creatinine, Ser 0.50 (*)    All other components within normal limits    EKG None  Radiology Dg Hand Complete Right  Result Date: 04/28/2018 CLINICAL DATA:  Broke a jar, has a piece of planned in his hand posteriorly EXAM: RIGHT HAND - COMPLETE 3+ VIEW COMPARISON:   RIGHT wrist radiographs 01/13/2012 FINDINGS: Osseous demineralization. Scattered degenerative changes and joint space narrowing greatest at radial border of carpus. Old healed fracture deformity of the proximal phalanx ring finger. No acute fracture, dislocation, or bone destruction. Dorsal soft tissue swelling at the RIGHT hand at the level of the metacarpals and carpals. No radiopaque foreign bodies identified. IMPRESSION: Soft tissue swelling dorsally at hand and wrist without identified radiopaque foreign body. Osseous demineralization with scattered degenerative changes RIGHT  hand/wrist. Electronically Signed   By: Lavonia Dana M.D.   On: 04/28/2018 19:01    Procedures Procedures (including critical care time)  Medications Ordered in ED Medications  acetaminophen (TYLENOL) tablet 650 mg (650 mg Oral Given 04/28/18 2017)  doxycycline (VIBRA-TABS) tablet 100 mg (100 mg Oral Given 04/28/18 2246)  Tdap (BOOSTRIX) injection 0.5 mL (0.5 mLs Intramuscular Given 04/28/18 2248)     Initial Impression / Assessment and Plan / ED Course  I have reviewed the triage vital signs and the nursing notes.  Pertinent labs & imaging results that were available during my care of the patient were reviewed by me and considered in my medical decision making (see chart for details).    Discussed pt presentation and exam findings with Dr. Roderic Palau, who personally evaluated the pt and agrees with plan to start on PO abx and have him f/u with Dr. Amedeo Plenty who he is already established with.   Final Clinical Impressions(s) / ED Diagnoses   Final diagnoses:  Cellulitis, unspecified cellulitis site   Pt presenting today with cellulitis of the right dorsal hand after cutting it on glass 1.5 weeks ago. Denies a h/o DM. No fevers/chills or systemic symptoms. Afebrile here, grossly normal vital signs.   Pt is without gross abscess for which I&D would be possible.  NVI. Area marked and pt encouraged to return if redness begins  to streak, extends beyond the markings, and/or fever or nausea/vomiting develop.  Pt is alert, oriented, NAD, afebrile, non tachycardic, nonseptic and nontoxic appearing.  Pt to be d/c on oral antibiotics with strict f/u instructions.  States he has seen Dr. Amedeo Plenty in the past and plans to follow-up with him.  I advised him to call the office tomorrow to make an appointment for follow-up and return if he has any worsening signs of infection.  Patient voiced understanding of plan reasons to return to the ED.  All questions answered.   ED Discharge Orders         Ordered    doxycycline (VIBRAMYCIN) 100 MG capsule  2 times daily     04/28/18 2252           Rodney Booze, PA-C 04/28/18 2253    Milton Ferguson, MD 04/30/18 1213

## 2018-04-28 NOTE — ED Notes (Signed)
Patient updated on delays and smiling and laughing with this RN.  Service recovered.

## 2018-05-03 DIAGNOSIS — M79641 Pain in right hand: Secondary | ICD-10-CM | POA: Diagnosis not present

## 2018-06-07 DIAGNOSIS — R634 Abnormal weight loss: Secondary | ICD-10-CM | POA: Diagnosis not present

## 2018-06-07 DIAGNOSIS — K2970 Gastritis, unspecified, without bleeding: Secondary | ICD-10-CM | POA: Diagnosis not present

## 2018-06-07 DIAGNOSIS — K219 Gastro-esophageal reflux disease without esophagitis: Secondary | ICD-10-CM | POA: Diagnosis not present

## 2018-06-07 DIAGNOSIS — K319 Disease of stomach and duodenum, unspecified: Secondary | ICD-10-CM | POA: Diagnosis not present

## 2018-06-07 DIAGNOSIS — R1013 Epigastric pain: Secondary | ICD-10-CM | POA: Diagnosis not present

## 2018-06-07 DIAGNOSIS — K2901 Acute gastritis with bleeding: Secondary | ICD-10-CM | POA: Diagnosis not present

## 2018-06-22 DIAGNOSIS — K219 Gastro-esophageal reflux disease without esophagitis: Secondary | ICD-10-CM | POA: Diagnosis not present

## 2018-06-22 DIAGNOSIS — Z86010 Personal history of colonic polyps: Secondary | ICD-10-CM | POA: Diagnosis not present

## 2018-06-22 DIAGNOSIS — F1020 Alcohol dependence, uncomplicated: Secondary | ICD-10-CM | POA: Diagnosis not present

## 2018-07-20 IMAGING — US US ABDOMEN LIMITED
1 series · 14 of 25 positions shown · non-contrast
Comparison: CT abdomen pelvis 03/11/2016

CLINICAL DATA: RIGHT upper quadrant pain for 2 weeks

EXAM:
ULTRASOUND ABDOMEN LIMITED RIGHT UPPER QUADRANT

[Series 1: us abdomen limited · 0.17mm/px · 14 of 43 slices shown]
[im 1/43]
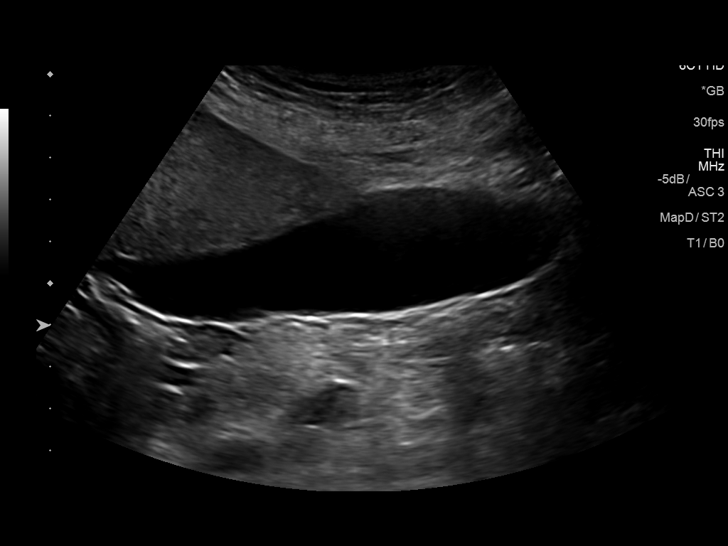
[im 4/43]
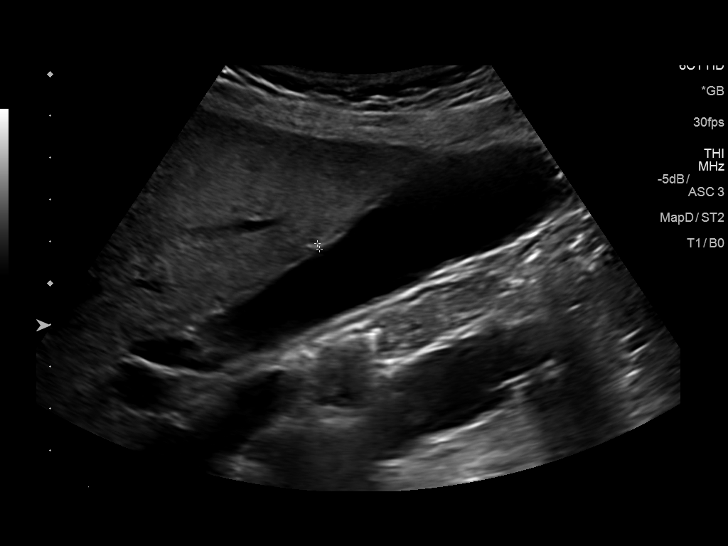
[im 8/43]
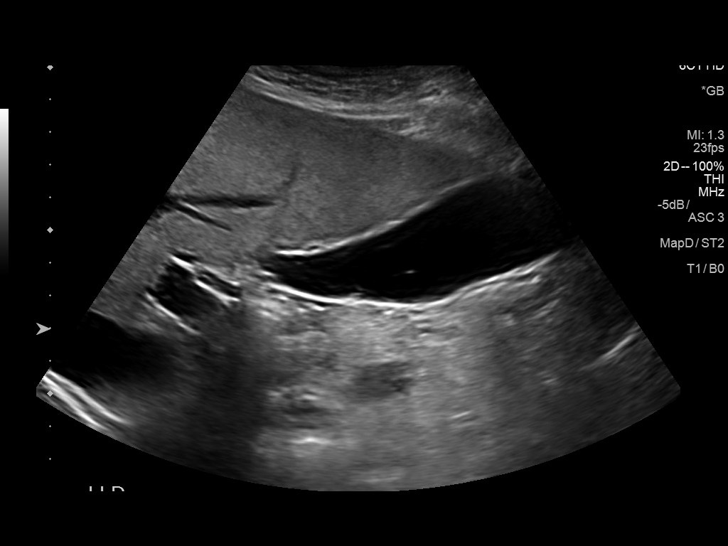
[im 11/43]
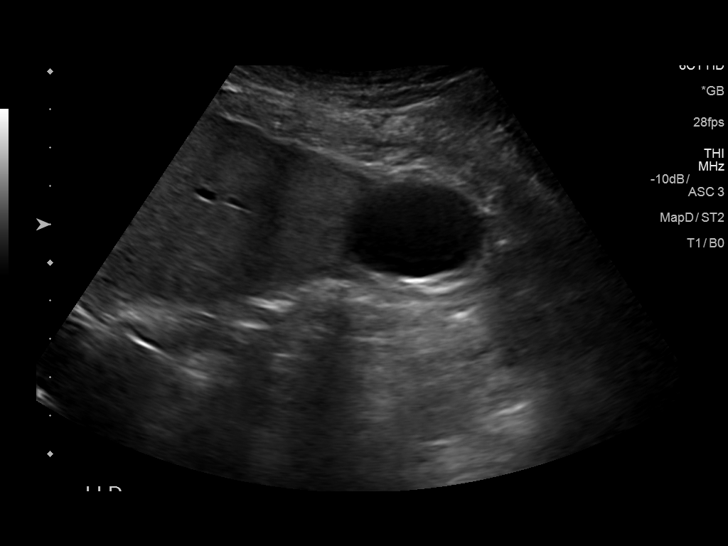
[im 15/43]
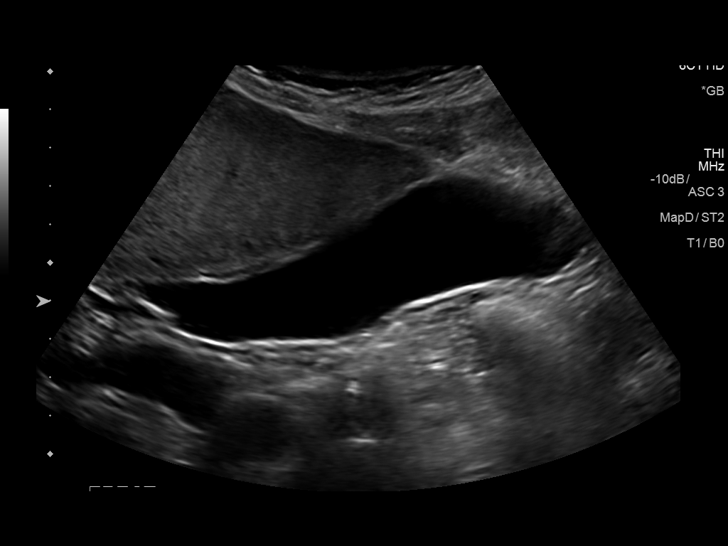
[im 16/43]
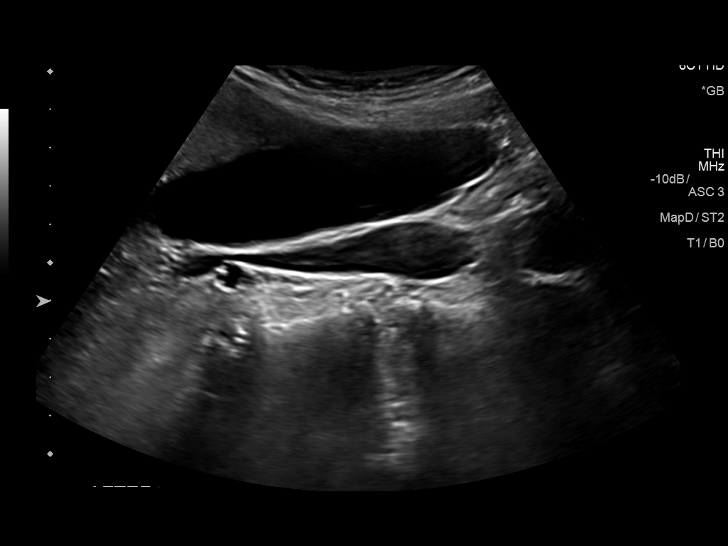
[im 20/43]
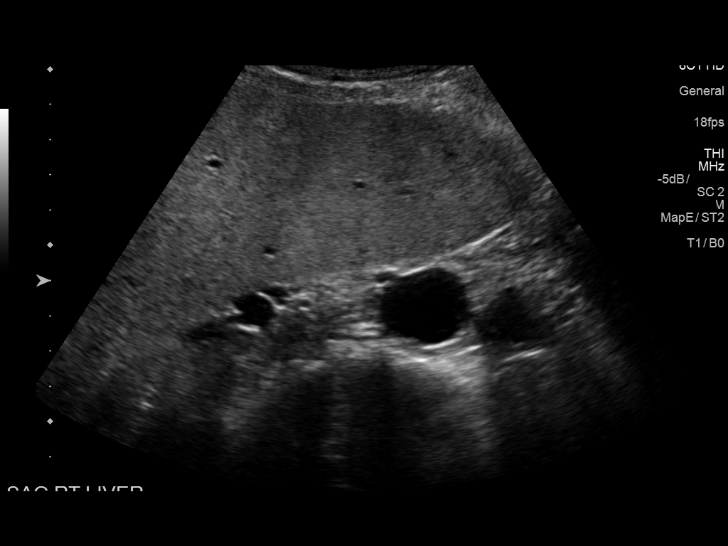
[im 23/43]
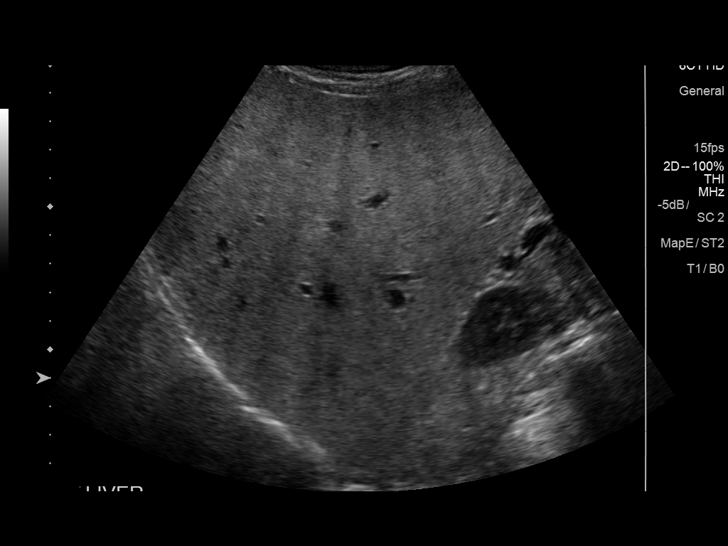
[im 27/43]
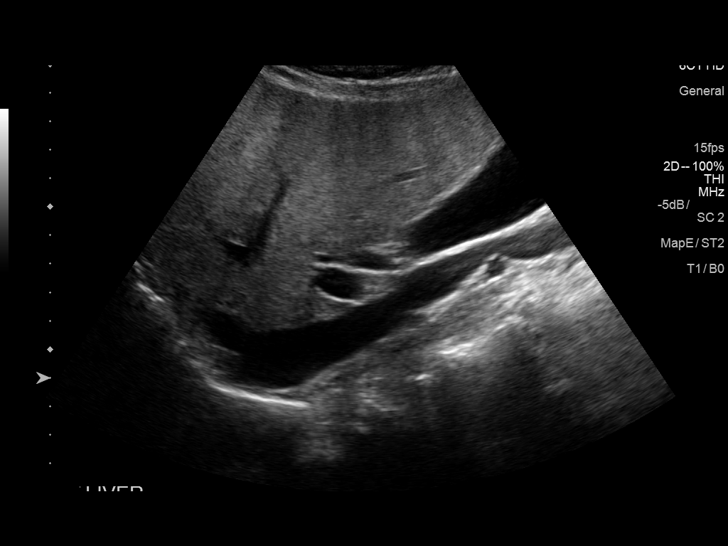
[im 29/43]
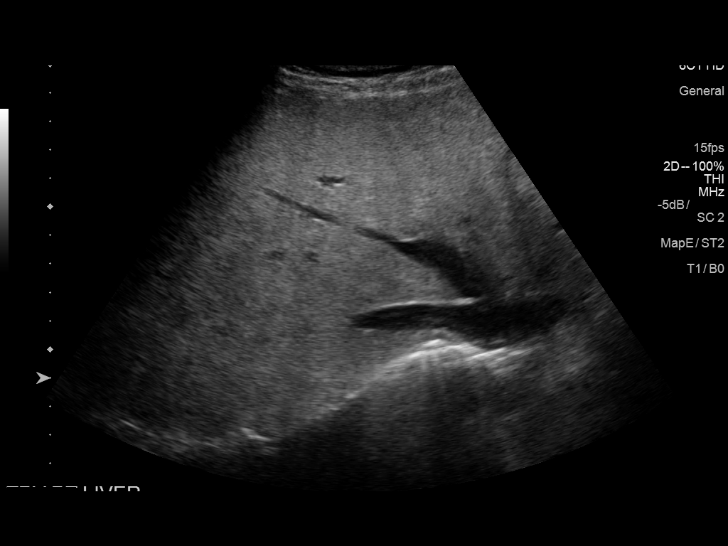
[im 32/43]
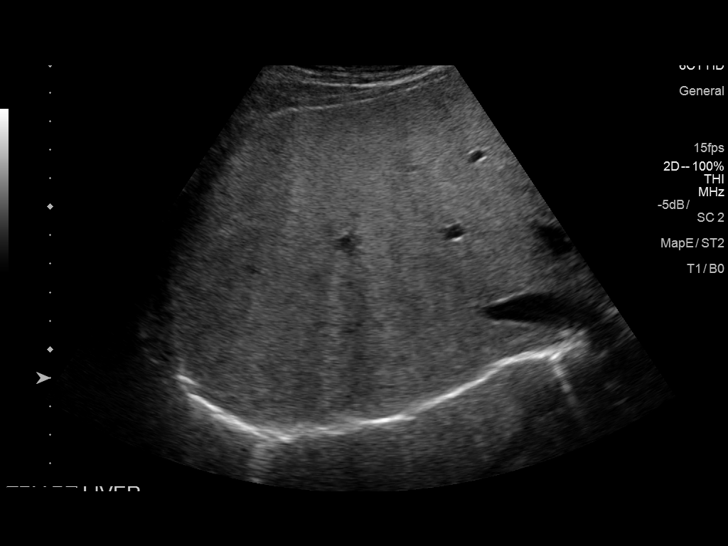
[im 36/43]
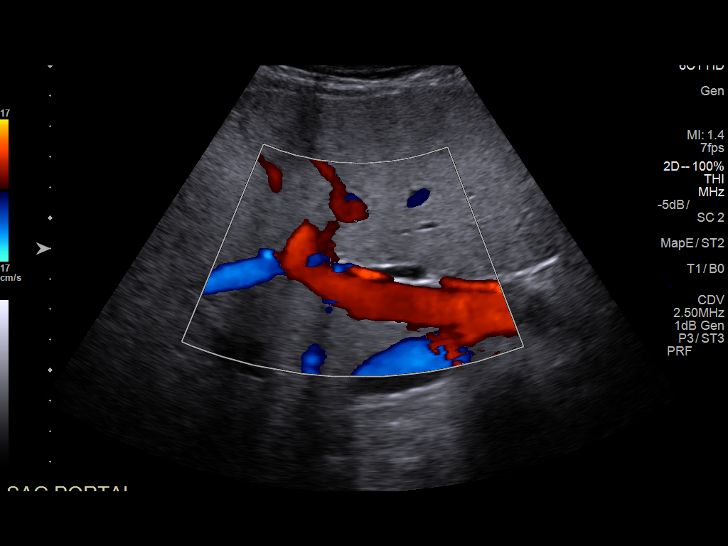
[im 39/43]
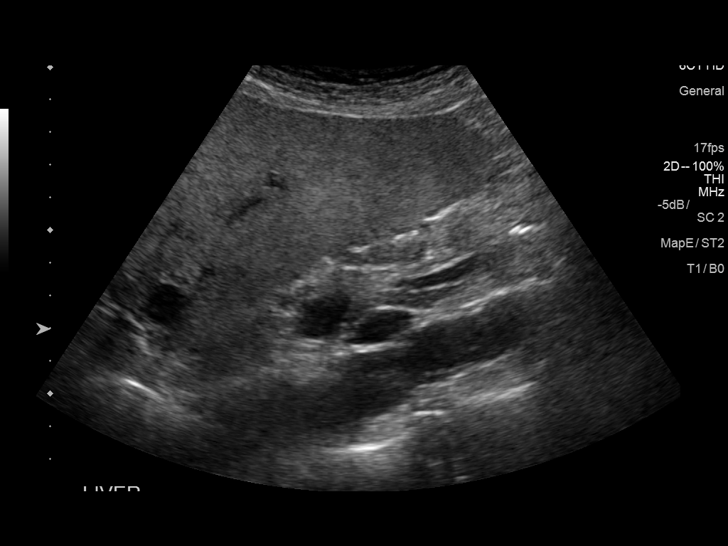
[im 43/43]
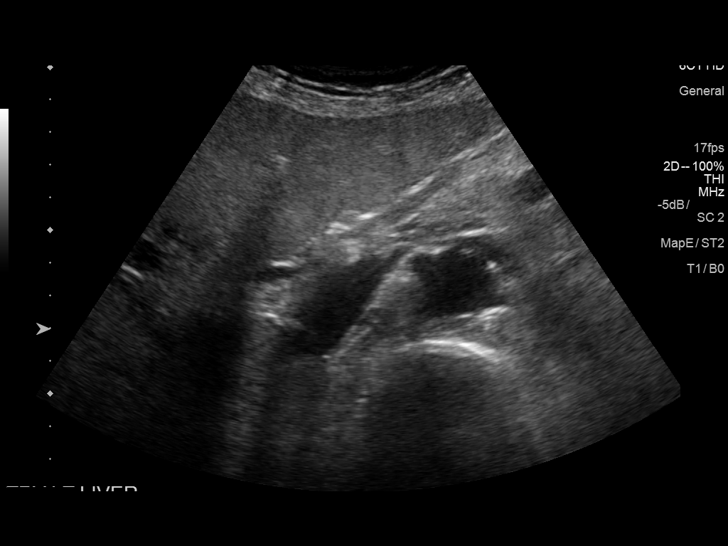

[14 of 25 positions shown; findings below may reference images not displayed]

FINDINGS: Gallbladder:

Normally distended without stones or wall thickening. No
pericholecystic fluid or sonographic Murphy sign.

Common bile duct:

Diameter: 5 mm diameter, normal

Liver:

Echogenic parenchyma, likely fatty infiltration though this can be
seen with cirrhosis and certain infiltrative disorders. Prior CT
exam demonstrated mildly decreased hepatic attenuation consistent
with fatty infiltration. No focal hepatic mass or nodularity. Portal
vein is patent on color Doppler imaging with normal direction of
blood flow towards the liver.

No RIGHT upper quadrant free fluid.
IMPRESSION: Fatty infiltration of liver.

Otherwise negative exam.

## 2018-09-14 DIAGNOSIS — Z114 Encounter for screening for human immunodeficiency virus [HIV]: Secondary | ICD-10-CM | POA: Diagnosis not present

## 2018-09-14 DIAGNOSIS — Z1159 Encounter for screening for other viral diseases: Secondary | ICD-10-CM | POA: Diagnosis not present

## 2018-09-14 DIAGNOSIS — Z13220 Encounter for screening for lipoid disorders: Secondary | ICD-10-CM | POA: Diagnosis not present

## 2018-09-14 DIAGNOSIS — F1020 Alcohol dependence, uncomplicated: Secondary | ICD-10-CM | POA: Diagnosis not present

## 2018-09-22 DIAGNOSIS — K5730 Diverticulosis of large intestine without perforation or abscess without bleeding: Secondary | ICD-10-CM | POA: Diagnosis not present

## 2018-09-22 DIAGNOSIS — B9681 Helicobacter pylori [H. pylori] as the cause of diseases classified elsewhere: Secondary | ICD-10-CM | POA: Diagnosis not present

## 2018-09-22 DIAGNOSIS — D72819 Decreased white blood cell count, unspecified: Secondary | ICD-10-CM | POA: Diagnosis not present

## 2019-01-04 DIAGNOSIS — E785 Hyperlipidemia, unspecified: Secondary | ICD-10-CM | POA: Diagnosis not present

## 2019-01-04 DIAGNOSIS — R740 Nonspecific elevation of levels of transaminase and lactic acid dehydrogenase [LDH]: Secondary | ICD-10-CM | POA: Diagnosis not present

## 2019-01-04 DIAGNOSIS — R7301 Impaired fasting glucose: Secondary | ICD-10-CM | POA: Diagnosis not present

## 2019-01-04 DIAGNOSIS — K279 Peptic ulcer, site unspecified, unspecified as acute or chronic, without hemorrhage or perforation: Secondary | ICD-10-CM | POA: Diagnosis not present

## 2019-07-05 DIAGNOSIS — M859 Disorder of bone density and structure, unspecified: Secondary | ICD-10-CM | POA: Diagnosis not present

## 2019-07-05 DIAGNOSIS — E7849 Other hyperlipidemia: Secondary | ICD-10-CM | POA: Diagnosis not present

## 2019-07-05 DIAGNOSIS — R7301 Impaired fasting glucose: Secondary | ICD-10-CM | POA: Diagnosis not present

## 2019-07-05 DIAGNOSIS — D539 Nutritional anemia, unspecified: Secondary | ICD-10-CM | POA: Diagnosis not present

## 2019-07-05 DIAGNOSIS — R7401 Elevation of levels of liver transaminase levels: Secondary | ICD-10-CM | POA: Diagnosis not present

## 2019-07-05 DIAGNOSIS — Z125 Encounter for screening for malignant neoplasm of prostate: Secondary | ICD-10-CM | POA: Diagnosis not present

## 2019-07-05 DIAGNOSIS — R634 Abnormal weight loss: Secondary | ICD-10-CM | POA: Diagnosis not present

## 2019-07-12 DIAGNOSIS — Z86010 Personal history of colonic polyps: Secondary | ICD-10-CM | POA: Diagnosis not present

## 2019-07-12 DIAGNOSIS — K5730 Diverticulosis of large intestine without perforation or abscess without bleeding: Secondary | ICD-10-CM | POA: Diagnosis not present

## 2019-07-12 DIAGNOSIS — R1013 Epigastric pain: Secondary | ICD-10-CM | POA: Diagnosis not present

## 2019-07-12 DIAGNOSIS — K219 Gastro-esophageal reflux disease without esophagitis: Secondary | ICD-10-CM | POA: Diagnosis not present

## 2019-10-14 DIAGNOSIS — R5383 Other fatigue: Secondary | ICD-10-CM | POA: Diagnosis not present

## 2019-10-14 DIAGNOSIS — R509 Fever, unspecified: Secondary | ICD-10-CM | POA: Diagnosis not present

## 2019-10-14 DIAGNOSIS — R197 Diarrhea, unspecified: Secondary | ICD-10-CM | POA: Diagnosis not present

## 2019-10-14 DIAGNOSIS — Z20818 Contact with and (suspected) exposure to other bacterial communicable diseases: Secondary | ICD-10-CM | POA: Diagnosis not present

## 2019-10-15 IMAGING — CT CT ABD-PELV W/ CM
2 of 5 series · 16 of 46 positions shown, 18 images · IV contrast (iopamidol)
Comparison: CT abdomen pelvis 03/11/2016

CLINICAL DATA: Right upper quadrant pain with abdominal cramping.
Right flank pain. Inguinal hernia.

EXAM:
CT ABDOMEN AND PELVIS WITH CONTRAST
TECHNIQUE: Multidetector CT imaging of the abdomen and pelvis was performed
using the standard protocol following bolus administration of
intravenous contrast.
CONTRAST:  100mL TSOVQL-IRR IOPAMIDOL (TSOVQL-IRR) INJECTION 61%

[Series 2: axial st · axial · 0.79mm/px · z∈[+1002,+1342]mm · 13 of 80 slices shown, 15 images]
[im 6/80  soft-tissue]
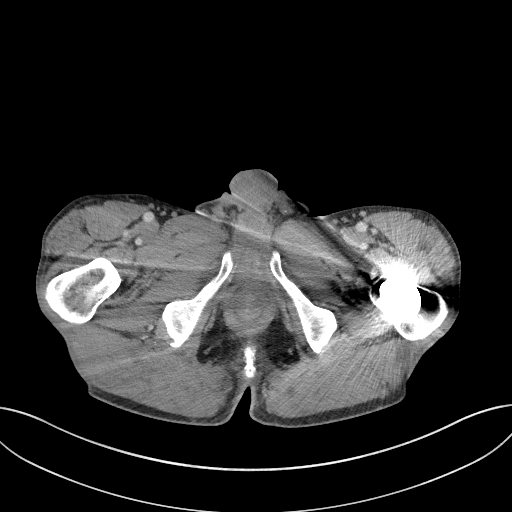
[im 6/80  bone]
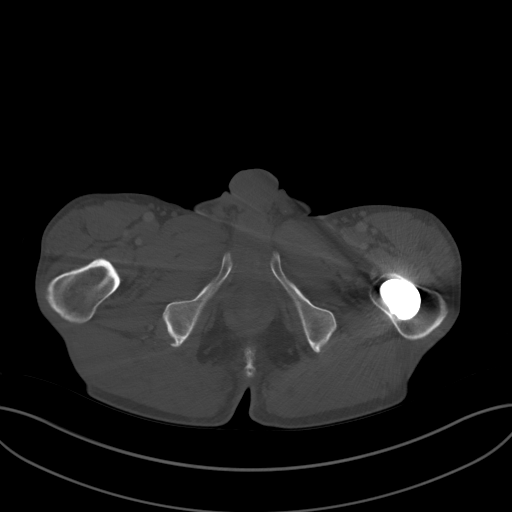
[im 12/80  soft-tissue]
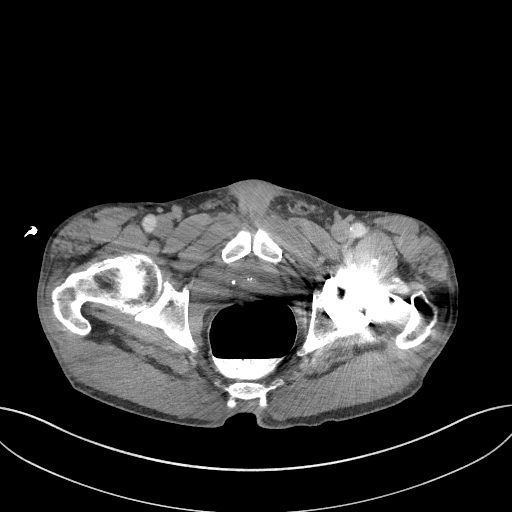
[im 17/80  soft-tissue]
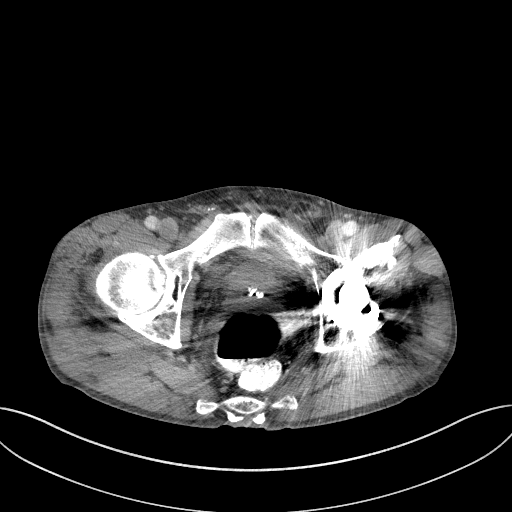
[im 23/80  soft-tissue]
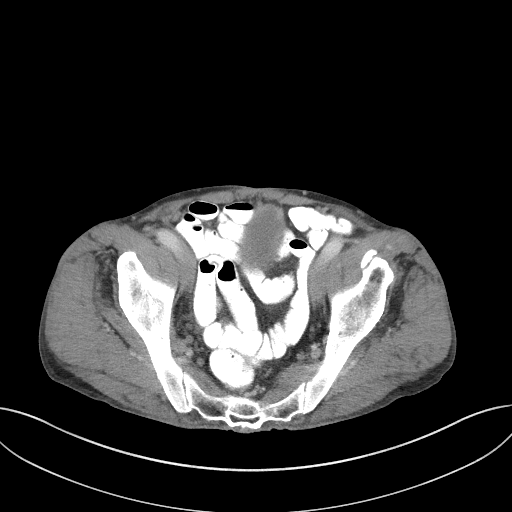
[im 29/80  soft-tissue]
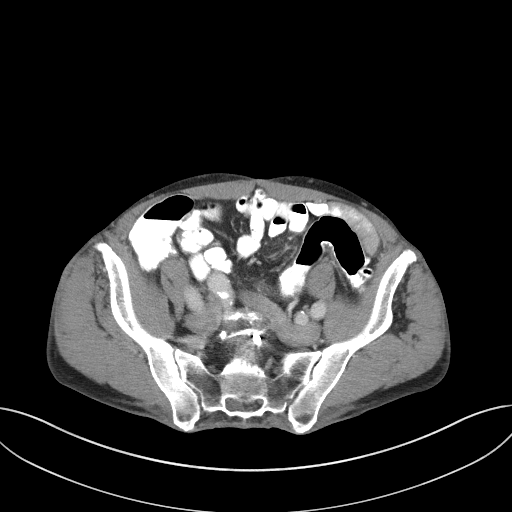
[im 34/80  soft-tissue]
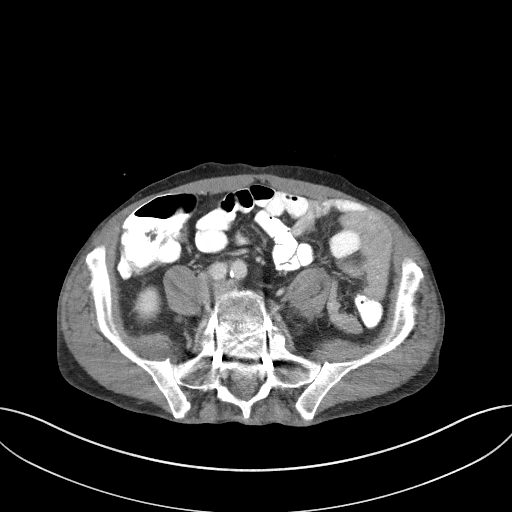
[im 40/80  soft-tissue]
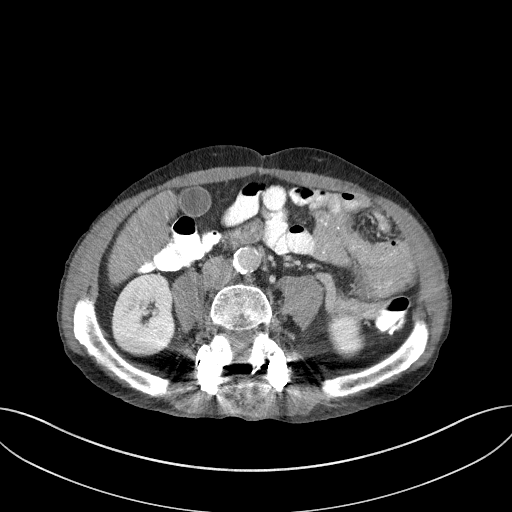
[im 46/80  soft-tissue]
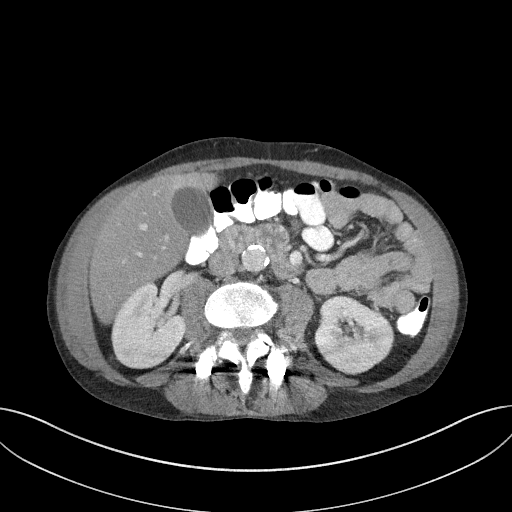
[im 51/80  soft-tissue]
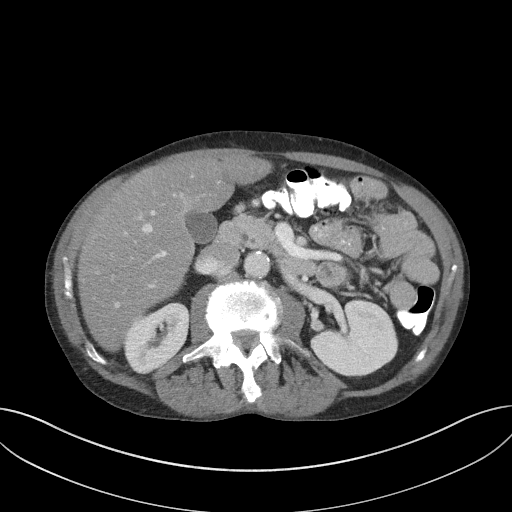
[im 51/80  bone]
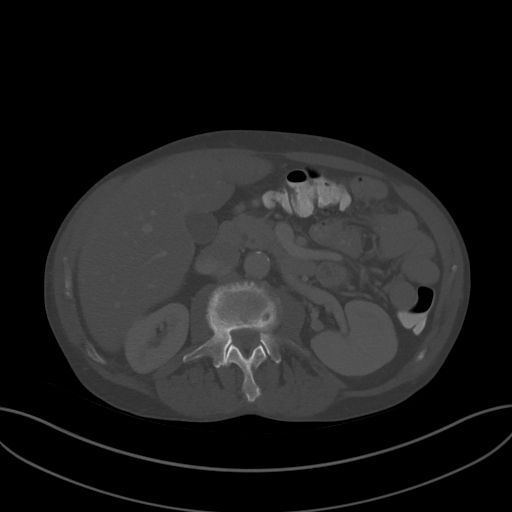
[im 57/80  soft-tissue]
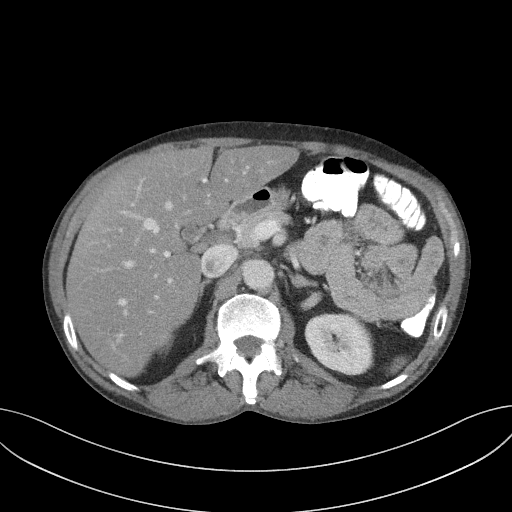
[im 63/80  soft-tissue]
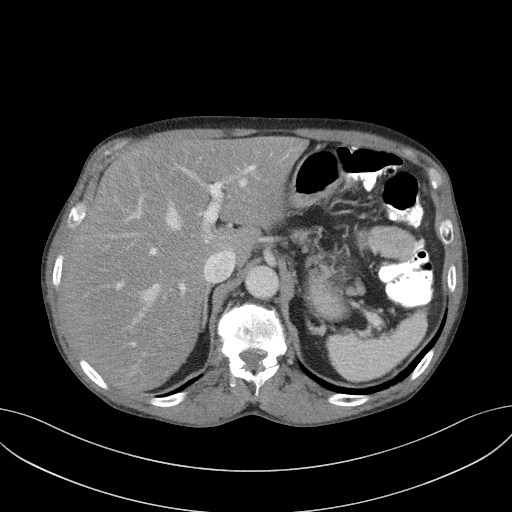
[im 68/80  soft-tissue]
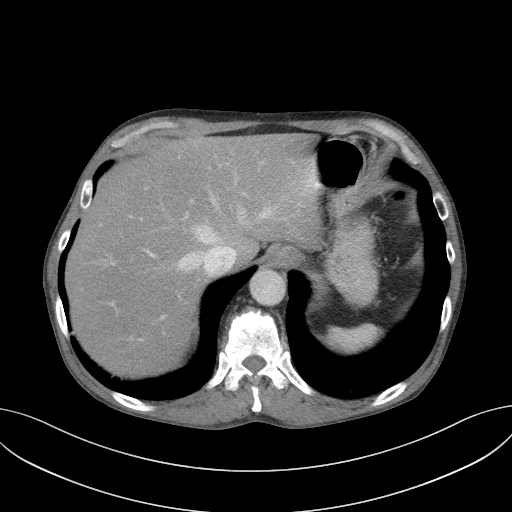
[im 74/80  soft-tissue]
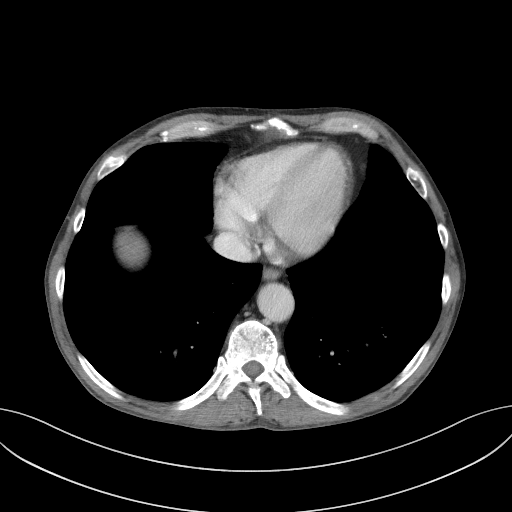

[Series 4: coronal st · coronal · 0.81mm/px · 3 of 86 slices shown]
[im 29/86  soft-tissue]
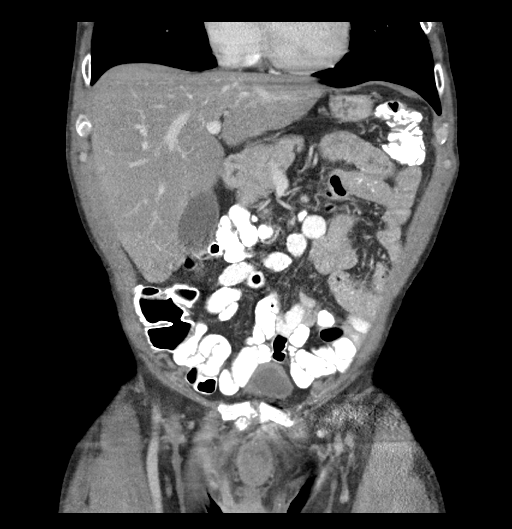
[im 38/86  soft-tissue]
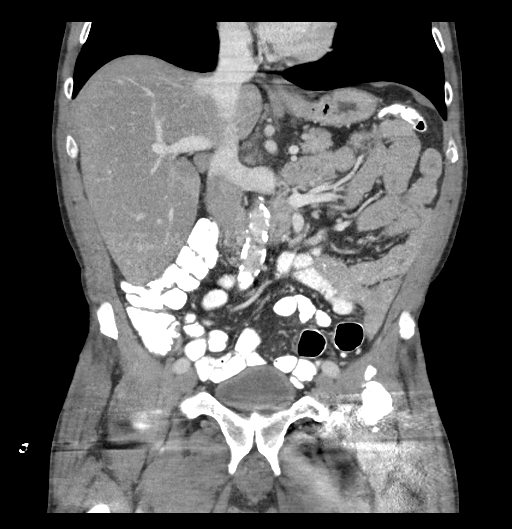
[im 48/86  soft-tissue]
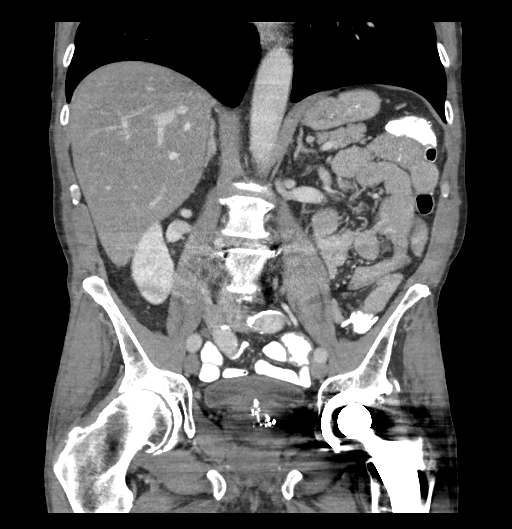

[16 of 46 positions shown; findings below may reference images not displayed]

FINDINGS: Lower chest: Lung bases clear bilaterally.  Heart size normal

Hepatobiliary: Fatty infiltration of liver without focal liver
lesion. Gallbladder and bile ducts normal.

Pancreas: Negative

Spleen: Negative

Adrenals/Urinary Tract: Normal kidneys. No renal tract calculi. No
obstruction or mass. Urinary bladder normal.

Stomach/Bowel: Negative for bowel obstruction. No bowel mass or
edema. No air-fluid levels in the bowel. Mild diverticulosis sigmoid
colon without bowel edema. Appendix not well visualized.

Vascular/Lymphatic: Atherosclerotic aorta and iliacs without
aneurysm. No lymphadenopathy

Reproductive: Mild prostate enlargement with numerous prostate
calcifications.

Other: Negative for free fluid. Small left inguinal hernia
containing fat

Musculoskeletal: Pedicle screw fusion L4-5 and L5-S1. Grade 2 slip
L5-S1 with foraminal encroachment bilaterally. Progressive disc
degeneration T12-L1 compared with the prior study. Negative for
fracture.
IMPRESSION: 1. Fatty infiltration liver. Negative for hepatobiliary acute
abnormality
2. Negative for renal tract calculi
3.   Mild sigmoid diverticulosis without diverticulitis
4. Prostate enlargement
5. Small left inguinal hernia

## 2020-01-03 ENCOUNTER — Other Ambulatory Visit: Payer: Self-pay | Admitting: Endocrinology

## 2020-01-03 DIAGNOSIS — K760 Fatty (change of) liver, not elsewhere classified: Secondary | ICD-10-CM | POA: Diagnosis not present

## 2020-01-03 DIAGNOSIS — J449 Chronic obstructive pulmonary disease, unspecified: Secondary | ICD-10-CM | POA: Diagnosis not present

## 2020-01-03 DIAGNOSIS — F17200 Nicotine dependence, unspecified, uncomplicated: Secondary | ICD-10-CM

## 2020-01-03 DIAGNOSIS — Z1389 Encounter for screening for other disorder: Secondary | ICD-10-CM | POA: Diagnosis not present

## 2020-01-03 DIAGNOSIS — M5090 Cervical disc disorder, unspecified, unspecified cervical region: Secondary | ICD-10-CM | POA: Diagnosis not present

## 2020-01-03 DIAGNOSIS — E559 Vitamin D deficiency, unspecified: Secondary | ICD-10-CM | POA: Diagnosis not present

## 2020-01-18 ENCOUNTER — Ambulatory Visit
Admission: RE | Admit: 2020-01-18 | Discharge: 2020-01-18 | Disposition: A | Discharge status: Home / Self Care | Payer: BC Managed Care – PPO | Source: Ambulatory Visit | Attending: Endocrinology | Admitting: Endocrinology

## 2020-01-18 DIAGNOSIS — F17200 Nicotine dependence, unspecified, uncomplicated: Secondary | ICD-10-CM

## 2020-01-18 DIAGNOSIS — J449 Chronic obstructive pulmonary disease, unspecified: Secondary | ICD-10-CM

## 2020-01-18 DIAGNOSIS — F17210 Nicotine dependence, cigarettes, uncomplicated: Secondary | ICD-10-CM | POA: Diagnosis not present

## 2020-04-27 DIAGNOSIS — Z96642 Presence of left artificial hip joint: Secondary | ICD-10-CM | POA: Diagnosis not present

## 2020-04-27 DIAGNOSIS — Z471 Aftercare following joint replacement surgery: Secondary | ICD-10-CM | POA: Diagnosis not present

## 2020-06-15 DIAGNOSIS — Z1152 Encounter for screening for COVID-19: Secondary | ICD-10-CM | POA: Diagnosis not present

## 2020-06-15 DIAGNOSIS — J441 Chronic obstructive pulmonary disease with (acute) exacerbation: Secondary | ICD-10-CM | POA: Diagnosis not present

## 2020-06-15 DIAGNOSIS — Z0000 Encounter for general adult medical examination without abnormal findings: Secondary | ICD-10-CM | POA: Diagnosis not present

## 2020-06-15 DIAGNOSIS — R059 Cough, unspecified: Secondary | ICD-10-CM | POA: Diagnosis not present

## 2020-06-15 DIAGNOSIS — E785 Hyperlipidemia, unspecified: Secondary | ICD-10-CM | POA: Diagnosis not present

## 2020-06-15 DIAGNOSIS — R7301 Impaired fasting glucose: Secondary | ICD-10-CM | POA: Diagnosis not present

## 2020-06-15 DIAGNOSIS — Z125 Encounter for screening for malignant neoplasm of prostate: Secondary | ICD-10-CM | POA: Diagnosis not present

## 2020-06-18 DIAGNOSIS — R82998 Other abnormal findings in urine: Secondary | ICD-10-CM | POA: Diagnosis not present

## 2020-06-20 DIAGNOSIS — R5383 Other fatigue: Secondary | ICD-10-CM | POA: Diagnosis not present

## 2020-07-02 DIAGNOSIS — M8580 Other specified disorders of bone density and structure, unspecified site: Secondary | ICD-10-CM | POA: Diagnosis not present

## 2020-07-02 DIAGNOSIS — J449 Chronic obstructive pulmonary disease, unspecified: Secondary | ICD-10-CM | POA: Diagnosis not present

## 2020-07-05 DIAGNOSIS — D2262 Melanocytic nevi of left upper limb, including shoulder: Secondary | ICD-10-CM | POA: Diagnosis not present

## 2020-07-05 DIAGNOSIS — D225 Melanocytic nevi of trunk: Secondary | ICD-10-CM | POA: Diagnosis not present

## 2020-07-05 DIAGNOSIS — L821 Other seborrheic keratosis: Secondary | ICD-10-CM | POA: Diagnosis not present

## 2020-07-20 DIAGNOSIS — F4323 Adjustment disorder with mixed anxiety and depressed mood: Secondary | ICD-10-CM | POA: Diagnosis not present

## 2020-07-20 DIAGNOSIS — F1020 Alcohol dependence, uncomplicated: Secondary | ICD-10-CM | POA: Diagnosis not present

## 2020-07-20 DIAGNOSIS — Z1212 Encounter for screening for malignant neoplasm of rectum: Secondary | ICD-10-CM | POA: Diagnosis not present

## 2020-08-03 DIAGNOSIS — H35363 Drusen (degenerative) of macula, bilateral: Secondary | ICD-10-CM | POA: Diagnosis not present

## 2020-08-03 DIAGNOSIS — H5203 Hypermetropia, bilateral: Secondary | ICD-10-CM | POA: Diagnosis not present

## 2020-08-03 DIAGNOSIS — H353131 Nonexudative age-related macular degeneration, bilateral, early dry stage: Secondary | ICD-10-CM | POA: Diagnosis not present

## 2020-08-30 DIAGNOSIS — R194 Change in bowel habit: Secondary | ICD-10-CM | POA: Diagnosis not present

## 2020-08-30 DIAGNOSIS — K5730 Diverticulosis of large intestine without perforation or abscess without bleeding: Secondary | ICD-10-CM | POA: Diagnosis not present

## 2020-08-30 DIAGNOSIS — R1012 Left upper quadrant pain: Secondary | ICD-10-CM | POA: Diagnosis not present

## 2020-08-30 DIAGNOSIS — K219 Gastro-esophageal reflux disease without esophagitis: Secondary | ICD-10-CM | POA: Diagnosis not present

## 2020-09-05 DIAGNOSIS — K219 Gastro-esophageal reflux disease without esophagitis: Secondary | ICD-10-CM | POA: Diagnosis not present

## 2020-09-05 DIAGNOSIS — R194 Change in bowel habit: Secondary | ICD-10-CM | POA: Diagnosis not present

## 2020-09-05 DIAGNOSIS — R1012 Left upper quadrant pain: Secondary | ICD-10-CM | POA: Diagnosis not present

## 2020-09-05 DIAGNOSIS — K635 Polyp of colon: Secondary | ICD-10-CM | POA: Diagnosis not present

## 2020-09-05 DIAGNOSIS — K2980 Duodenitis without bleeding: Secondary | ICD-10-CM | POA: Diagnosis not present

## 2020-09-05 DIAGNOSIS — K3189 Other diseases of stomach and duodenum: Secondary | ICD-10-CM | POA: Diagnosis not present

## 2020-09-05 DIAGNOSIS — K2970 Gastritis, unspecified, without bleeding: Secondary | ICD-10-CM | POA: Diagnosis not present

## 2020-09-05 DIAGNOSIS — D125 Benign neoplasm of sigmoid colon: Secondary | ICD-10-CM | POA: Diagnosis not present

## 2020-09-05 DIAGNOSIS — K5730 Diverticulosis of large intestine without perforation or abscess without bleeding: Secondary | ICD-10-CM | POA: Diagnosis not present

## 2020-09-05 DIAGNOSIS — D120 Benign neoplasm of cecum: Secondary | ICD-10-CM | POA: Diagnosis not present

## 2020-09-05 DIAGNOSIS — Z1211 Encounter for screening for malignant neoplasm of colon: Secondary | ICD-10-CM | POA: Diagnosis not present

## 2021-01-01 DIAGNOSIS — M8580 Other specified disorders of bone density and structure, unspecified site: Secondary | ICD-10-CM | POA: Diagnosis not present

## 2021-01-01 DIAGNOSIS — R7301 Impaired fasting glucose: Secondary | ICD-10-CM | POA: Diagnosis not present

## 2021-01-07 ENCOUNTER — Other Ambulatory Visit: Payer: Self-pay | Admitting: Endocrinology

## 2021-01-07 DIAGNOSIS — J449 Chronic obstructive pulmonary disease, unspecified: Secondary | ICD-10-CM

## 2021-01-07 DIAGNOSIS — F17200 Nicotine dependence, unspecified, uncomplicated: Secondary | ICD-10-CM

## 2021-01-24 ENCOUNTER — Ambulatory Visit
Admission: RE | Admit: 2021-01-24 | Discharge: 2021-01-24 | Disposition: A | Discharge status: Home / Self Care | Payer: BC Managed Care – PPO | Source: Ambulatory Visit | Attending: Endocrinology | Admitting: Endocrinology

## 2021-01-24 DIAGNOSIS — J439 Emphysema, unspecified: Secondary | ICD-10-CM | POA: Diagnosis not present

## 2021-01-24 DIAGNOSIS — J449 Chronic obstructive pulmonary disease, unspecified: Secondary | ICD-10-CM

## 2021-01-24 DIAGNOSIS — F17200 Nicotine dependence, unspecified, uncomplicated: Secondary | ICD-10-CM

## 2021-01-24 DIAGNOSIS — K314 Gastric diverticulum: Secondary | ICD-10-CM | POA: Diagnosis not present

## 2021-01-24 DIAGNOSIS — Z87891 Personal history of nicotine dependence: Secondary | ICD-10-CM | POA: Diagnosis not present

## 2021-01-24 DIAGNOSIS — I2510 Atherosclerotic heart disease of native coronary artery without angina pectoris: Secondary | ICD-10-CM | POA: Diagnosis not present

## 2021-02-21 DIAGNOSIS — M1611 Unilateral primary osteoarthritis, right hip: Secondary | ICD-10-CM | POA: Diagnosis not present

## 2021-05-23 DIAGNOSIS — C4339 Malignant melanoma of other parts of face: Secondary | ICD-10-CM | POA: Diagnosis not present

## 2021-06-05 DIAGNOSIS — C4339 Malignant melanoma of other parts of face: Secondary | ICD-10-CM | POA: Diagnosis not present

## 2021-06-05 DIAGNOSIS — D2239 Melanocytic nevi of other parts of face: Secondary | ICD-10-CM | POA: Diagnosis not present

## 2021-06-06 NOTE — Patient Instructions (Signed)
DUE TO COVID-19 ONLY ONE VISITOR IS ALLOWED TO COME WITH YOU AND STAY IN THE WAITING ROOM ONLY DURING PRE OP AND PROCEDURE.   **NO VISITORS ARE ALLOWED IN THE SHORT STAY AREA OR RECOVERY ROOM!!**  IF YOU WILL BE ADMITTED INTO THE HOSPITAL YOU ARE ALLOWED ONLY TWO SUPPORT PEOPLE DURING VISITATION HOURS ONLY (10AM -8PM)   The support person(s) may change daily. The support person(s) must pass our screening, gel in and out, and wear a mask at all times, including in the patient's room. Patients must also wear a mask when staff or their support person are in the room.  No visitors under the age of 64. Any visitor under the age of 25 must be accompanied by an adult.    COVID SWAB TESTING MUST BE COMPLETED ON:  06/10/21 **MUST PRESENT COMPLETED FORM AT TESTING SITE**    Corey Arnold [place] (backside of the building) You are not required to quarantine, however you are required to wear a well-fitted mask when you are out and around people not in your household.  Hand Hygiene often Do NOT share personal items Notify your provider if you are in close contact with someone who has COVID or you develop fever 100.4 or greater, new onset of sneezing, cough, sore throat, shortness of breath or body aches.  [address], [address], must go inside of the hospital, NOT A DRIVE THRU!  (Must self quarantine after testing. Follow instructions on handout.)       Your procedure is scheduled on: 06/12/21   Report to [facility] Main Entrance    Report to admitting at: 8:00 AM   Call this number if you have problems the morning of surgery [phone]   Do not eat food :After Midnight.   May have liquids until: 7:45 AM    day of surgery  CLEAR LIQUID DIET  Foods Allowed                                                                     Foods Excluded  Water, Black Coffee and tea, regular and decaf                              liquids that you cannot  Plain Jell-O in any flavor  (No red)                                           see through such as: Fruit ices (not with fruit pulp)                                     milk, soups, orange juice              Iced Popsicles (No red)  All solid food                                   Apple juices Sports drinks like Gatorade (No red) Lightly seasoned clear broth or consume(fat free) Sugar,   Sample Menu Breakfast                                Lunch                                     Supper Cranberry juice                    Beef broth                            Chicken broth Jell-O                                     Grape juice                           Apple juice Coffee or tea                        Jell-O                                      Popsicle                                                Coffee or tea                        Coffee or tea      Complete one Ensure drink the morning of surgery at : 7:45 AM      the day of surgery.    The day of surgery:  Drink ONE (1) Pre-Surgery Clear Ensure or G2 by am the morning of surgery. Drink in one sitting. Do not sip.  This drink was given to you during your hospital  pre-op appointment visit. Nothing else to drink after completing the  Pre-Surgery Clear Ensure or G2.          If you have questions, please contact your surgeon's office.     Oral Hygiene is also important to reduce your risk of infection.                                    Remember - BRUSH YOUR TEETH THE MORNING OF SURGERY WITH YOUR REGULAR TOOTHPASTE   Do NOT smoke after Midnight   Take these medicines the morning of surgery with A SIP OF WATER: dexlansoprazole.  DO NOT TAKE ANY ORAL DIABETIC MEDICATIONS DAY OF YOUR SURGERY  You may not have any metal on your body including hair pins, jewelry, and body piercing             Do not wear lotions, powders,  perfumes/cologne, or deodorant              Men may shave face and neck.   Do not bring valuables to the hospital. [place].   Contacts, dentures or bridgework may not be worn into surgery.   Bring small overnight bag day of surgery.    Patients discharged on the day of surgery will not be allowed to drive home.   Special Instructions: Bring a copy of your healthcare power of attorney and living will documents         the day of surgery if you haven't scanned them before.              Please read over the following fact sheets you were given: IF YOU HAVE QUESTIONS ABOUT YOUR PRE-OP INSTRUCTIONS PLEASE CALL [phone]   [facility] - Preparing for Surgery Before surgery, you can play an important role.  Because skin is not sterile, your skin needs to be as free of germs as possible.  You can reduce the number of germs on your skin by washing with CHG (chlorahexidine gluconate) soap before surgery.  CHG is an antiseptic cleaner which kills germs and bonds with the skin to continue killing germs even after washing. Please DO NOT use if you have an allergy to CHG or antibacterial soaps.  If your skin becomes reddened/irritated stop using the CHG and inform your nurse when you arrive at Short Stay. Do not shave (including legs and underarms) for at least 48 hours prior to the first CHG shower.  You may shave your face/neck. Please follow these instructions carefully:  1.  Shower with CHG Soap the night before surgery and the  morning of Surgery.  2.  If you choose to wash your hair, wash your hair first as usual with your  normal  shampoo.  3.  After you shampoo, rinse your hair and body thoroughly to remove the  shampoo.                           4.  Use CHG as you would any other liquid soap.  You can apply chg directly  to the skin and wash                       Gently with a scrungie or clean washcloth.  5.  Apply the CHG Soap to your body  ONLY FROM THE NECK DOWN.   Do not use on face/ open                           Wound or open sores. Avoid contact with eyes, ears mouth and genitals (private parts).                       Wash face,  Genitals (private parts) with your normal soap.             6.  Wash thoroughly, paying special attention to the area where your surgery  will be performed.  7.  Thoroughly rinse your body with warm water from  the neck down.  8.  DO NOT shower/wash with your normal soap after using and rinsing off  the CHG Soap.                9.  Pat yourself dry with a clean towel.            10.  Wear clean pajamas.            11.  Place clean sheets on your bed the night of your first shower and do not  sleep with pets. Day of Surgery : Do not apply any lotions/deodorants the morning of surgery.  Please wear clean clothes to the hospital/surgery center.  FAILURE TO FOLLOW THESE INSTRUCTIONS MAY RESULT IN THE CANCELLATION OF YOUR SURGERY PATIENT SIGNATURE_________________________________  NURSE SIGNATURE__________________________________  ________________________________________________________________________   Adam Phenix  An incentive spirometer is a tool that can help keep your lungs clear and active. This tool measures how well you are filling your lungs with each breath. Taking long deep breaths may help reverse or decrease the chance of developing breathing (pulmonary) problems (especially infection) following: A long period of time when you are unable to move or be active. BEFORE THE PROCEDURE  If the spirometer includes an indicator to show your best effort, your nurse or respiratory therapist will set it to a desired goal. If possible, sit up straight or lean slightly forward. Try not to slouch. Hold the incentive spirometer in an upright position. INSTRUCTIONS FOR USE  Sit on the edge of your bed if possible, or sit up as far as you can in bed or on a chair. Hold the incentive  spirometer in an upright position. Breathe out normally. Place the mouthpiece in your mouth and seal your lips tightly around it. Breathe in slowly and as deeply as possible, raising the piston or the ball toward the top of the column. Hold your breath for 3-5 seconds or for as long as possible. Allow the piston or ball to fall to the bottom of the column. Remove the mouthpiece from your mouth and breathe out normally. Rest for a few seconds and repeat Steps 1 through 7 at least 10 times every 1-2 hours when you are awake. Take your time and take a few normal breaths between deep breaths. The spirometer may include an indicator to show your best effort. Use the indicator as a goal to work toward during each repetition. After each set of 10 deep breaths, practice coughing to be sure your lungs are clear. If you have an incision (the cut made at the time of surgery), support your incision when coughing by placing a pillow or rolled up towels firmly against it. Once you are able to get out of bed, walk around indoors and cough well. You may stop using the incentive spirometer when instructed by your caregiver.  RISKS AND COMPLICATIONS Take your time so you do not get dizzy or light-headed. If you are in pain, you may need to take or ask for pain medication before doing incentive spirometry. It is harder to take a deep breath if you are having pain. AFTER USE Rest and breathe slowly and easily. It can be helpful to keep track of a log of your progress. Your caregiver can provide you with a simple table to help with this. If you are using the spirometer at home, follow these instructions: [place] IF:  You are having difficultly using the spirometer. You have trouble using the spirometer as often as  instructed. Your pain medication is not giving enough relief while using the spirometer. You develop fever of 100.5 F (38.1 C) or higher. SEEK IMMEDIATE MEDICAL CARE IF:  You cough up bloody  sputum that had not been present before. You develop fever of 102 F (38.9 C) or greater. You develop worsening pain at or near the incision site. MAKE SURE YOU:  Understand these instructions. Will watch your condition. Will get help right away if you are not doing well or get worse. Document Released: 12/15/2006 Document Revised: 10/27/2011 Document Reviewed: 02/15/2007 [facility] Patient Information 2014 [place], [place].   ________________________________________________________________________

## 2021-06-07 ENCOUNTER — Other Ambulatory Visit: Payer: Self-pay

## 2021-06-07 ENCOUNTER — Encounter (HOSPITAL_COMMUNITY)
Admission: RE | Admit: 2021-06-07 | Discharge: 2021-06-07 | Disposition: A | Discharge status: Home / Self Care | Payer: BC Managed Care – PPO | Source: Ambulatory Visit | Attending: Orthopedic Surgery | Admitting: Orthopedic Surgery

## 2021-06-07 ENCOUNTER — Encounter (HOSPITAL_COMMUNITY): Payer: Self-pay

## 2021-06-07 VITALS — BP 109/74 | HR 79 | Temp 98.2°F | Ht 69.0 in | Wt 165.0 lb

## 2021-06-07 DIAGNOSIS — Z01818 Encounter for other preprocedural examination: Secondary | ICD-10-CM

## 2021-06-07 DIAGNOSIS — Z01812 Encounter for preprocedural laboratory examination: Secondary | ICD-10-CM | POA: Insufficient documentation

## 2021-06-07 HISTORY — DX: Malignant (primary) neoplasm, unspecified: C80.1

## 2021-06-07 LAB — COMPREHENSIVE METABOLIC PANEL
ALT: 13 U/L (ref 0–44)
AST: 14 U/L — ABNORMAL LOW (ref 15–41)
Albumin: 4 g/dL (ref 3.5–5.0)
Alkaline Phosphatase: 54 U/L (ref 38–126)
Anion gap: 3 — ABNORMAL LOW (ref 5–15)
BUN: 23 mg/dL (ref 8–23)
CO2: 26 mmol/L (ref 22–32)
Calcium: 9 mg/dL (ref 8.9–10.3)
Chloride: 108 mmol/L (ref 98–111)
Creatinine, Ser: 0.68 mg/dL (ref 0.61–1.24)
GFR, Estimated: >60 mL/min (ref >60)
Glucose, Bld: 131 mg/dL — ABNORMAL HIGH (ref 70–99)
Potassium: 3.7 mmol/L (ref 3.5–5.1)
Sodium: 137 mmol/L (ref 135–145)
Total Bilirubin: 0.7 mg/dL (ref 0.3–1.2)
Total Protein: 6.8 g/dL (ref 6.5–8.1)

## 2021-06-07 LAB — CBC
HCT: 42.7 % (ref 39.0–52.0)
Hemoglobin: 13.9 g/dL (ref 13.0–17.0)
MCH: 31.4 pg (ref 26.0–34.0)
MCHC: 32.6 g/dL (ref 30.0–36.0)
MCV: 96.4 fL (ref 80.0–100.0)
Platelets: 261 10*3/uL (ref 150–400)
RBC: 4.43 MIL/uL (ref 4.22–5.81)
RDW: 13.2 % (ref 11.5–15.5)
WBC: 10 10*3/uL (ref 4.0–10.5)
nRBC: 0 % (ref 0.0–0.2)

## 2021-06-07 LAB — PROTIME-INR
INR: 0.9 (ref 0.8–1.2)
Prothrombin Time: 12.6 seconds (ref 11.4–15.2)

## 2021-06-07 LAB — SURGICAL PCR SCREEN
MRSA, PCR: NEGATIVE
Staphylococcus aureus: NEGATIVE

## 2021-06-07 NOTE — Progress Notes (Signed)
COVID Vaccine Completed: Yes Date COVID Vaccine completed: 05/2020. X 3 COVID vaccine manufacturer: [place] Test: 06/10/21   PCP - Dr. Reynold Bowen Cardiologist -   Chest x-ray - 01/25/21 EKG -  Stress Test -  ECHO -  Cardiac Cath -  Pacemaker/ICD device last checked:  Sleep Study -  CPAP -   Fasting Blood Sugar -  Checks Blood Sugar _____ times a day  Blood Thinner Instructions: Aspirin Instructions: Last Dose:  Anesthesia review:   Patient denies shortness of breath, fever, cough and chest pain at PAT appointment   Patient verbalized understanding of instructions that were given to them at the PAT appointment. Patient was also instructed that they will need to review over the PAT instructions again at home before surgery.

## 2021-06-10 ENCOUNTER — Other Ambulatory Visit: Payer: Self-pay | Admitting: Orthopedic Surgery

## 2021-06-11 LAB — SARS CORONAVIRUS 2 (TAT 6-24 HRS): SARS Coronavirus 2: NEGATIVE

## 2021-06-11 NOTE — H&P (Signed)
TOTAL HIP ADMISSION H&P  Patient is admitted for right total hip arthroplasty.  Subjective:  Chief Complaint: Right hip pain  HPI: Corey Arnold, 65 y.o. male, has a history of pain and functional disability in the right hip due to arthritis and patient has failed non-surgical conservative treatments for greater than 12 weeks to include NSAID's and/or analgesics, flexibility and strengthening excercises, and activity modification. Onset of symptoms was gradual, starting  several  years ago with gradually worsening course since that time. The patient noted no past surgery on the right hip. Patient currently rates pain in the right hip at 7 out of 10 with activity. Patient has worsening of pain with activity and weight bearing, pain that interfers with activities of daily living, and pain with passive range of motion. Patient has evidence of periarticular osteophytes and joint space narrowing by imaging studies. This condition presents safety issues increasing the risk of falls. There is no current active infection.  Patient Active Problem List   Diagnosis Date Noted   Cough 11/06/2010   COPD (chronic obstructive pulmonary disease) ([place]) 11/06/2010   Prostatitis    Substance abuse ([place])    Osteopenia    History of colon polyps    C7 radiculopathy     Past Medical History:  Diagnosis Date   ADHD (attention deficit hyperactivity disorder)    Cancer ([place])    Cervical radiculopathy at C7    History of colon polyps 09/18/2006   Osteopenia    Prostatitis    Substance abuse ([place])     Past Surgical History:  Procedure Laterality Date   BCCA  2002   CERVICAL FUSION     C4 - C6   COLOSTOMY     HIP CLOSED REDUCTION  05/31/2012   Procedure: CLOSED MANIPULATION HIP;  Surgeon: Tobi Bastos, MD;  Location: [facility] ORS;  Service: Orthopedics;  Laterality: Left;   INGUINAL HERNIA REPAIR     LUMBAR [place] SURGERY     TOTAL HIP ARTHROPLASTY  2001    Prior to Admission medications   Medication  Sig Start Date End Date Taking? Authorizing Provider  acetaminophen (TYLENOL) 650 MG CR tablet Take 1,300 mg by mouth every 8 (eight) hours as needed for pain.   Yes [provider]  albuterol (VENTOLIN HFA) 108 (90 Base) MCG/ACT inhaler Inhale 1-2 puffs into the lungs every 6 (six) hours as needed for wheezing or shortness of breath. 03/29/21  Yes [provider]  Cholecalciferol (VITAMIN D) 125 MCG (5000 UT) CAPS Take 5,000 Units by mouth daily.   Yes [provider]  dexlansoprazole (DEXILANT) 60 MG capsule Take 60 mg by mouth daily.   Yes [provider]  doxycycline (VIBRAMYCIN) 100 MG capsule Take 100 mg by mouth 2 (two) times daily. 5 DS 06/05/21  Yes [provider]  rosuvastatin (CRESTOR) 5 MG tablet Take 5 mg by mouth every other day. 02/19/21  Yes [provider]  traZODone (DESYREL) 100 MG tablet Take 100-200 mg by mouth at bedtime as needed. 05/20/21  Yes [provider]    Allergies  Allergen Reactions   Latex Itching    Social History   Socioeconomic History   Marital status: Single    Spouse name: Not on file   Number of children: 2   Years of education: Not on file   Highest education level: Not on file  Occupational History   Occupation: president of [employer]  Tobacco Use   Smoking status: Every Day  Packs/day: 1.00    Years: 40.00    Pack years: 40.00    Types: Cigarettes    Last attempt to quit: 09/02/2010    Years since quitting: 10.7   Smokeless tobacco: Never  Vaping Use   Vaping Use: Never used  Substance and Sexual Activity   Alcohol use: Not Currently   Drug use: Not Currently   Sexual activity: Not on file  Other Topics Concern   Not on file  Social History Narrative   Not on file   Social Determinants of Health   Financial Resource Strain: Not on file  Food Insecurity: Not on file  Transportation Needs: Not on file  Physical Activity: Not on file  Stress: Not on file   Social Connections: Not on file  Intimate Partner Violence: Not on file    Tobacco Use: High Risk   Smoking Tobacco Use: Every Day   Smokeless Tobacco Use: Never   Passive Exposure: Not on file   Social History   Substance and Sexual Activity  Alcohol Use Not Currently    Family History  Adopted: Yes  Problem Relation Age of Onset   Pulmonary fibrosis Father    Stroke Mother     ROS: Constitutional: no fever, no chills, no night sweats, no significant weight loss Cardiovascular: no chest pain, no palpitations Respiratory: no cough, no shortness of breath, No COPD Gastrointestinal: no vomiting, no nausea Musculoskeletal: no swelling in Joints, Joint Pain Neurologic: no numbness, no tingling, no difficulty with balance    Objective:  Physical Exam: Well nourished and well developed.  General: Alert and oriented x3, cooperative and pleasant, no acute distress.  Head: normocephalic, atraumatic, neck supple.  Eyes: EOMI.  Respiratory: breath sounds clear in all fields, no wheezing, rales, or rhonchi. Cardiovascular: Regular rate and rhythm, no murmurs, gallops or rubs.  Abdomen: non-tender to palpation and soft, normoactive bowel sounds. Musculoskeletal:  The patient has an antalgic gait pattern favoring the right side without the use of assistive devices.     Right Hip Exam:   The range of motion: Flexion to 110 degrees, Internal Rotation is minimal, External Rotation to 20 to 30 degrees, and abduction to 30 degrees without discomfort.   There is no tenderness over the greater trochanteric bursa.     Left Hip Exam:   The range of motion: Flexion to 120 degrees, Internal Rotation to 20 degrees, External Rotation to 30 degrees, and abduction to 30 degrees without discomfort.   There is no tenderness over the greater trochanteric bursa.   Calves soft and nontender. Motor function intact in LE. Strength 5/5 LE bilaterally. Neuro: Distal pulses 2+. Sensation to light  touch intact in LE.     Vital signs in last 24 hours:    Imaging Review Radiographs - AP pelvis, AP and lateral of the right hip from today demonstrate bone-on-bone arthritis with inferior osteophyte formation. His left hip shows the prosthesis in good position with some polyethylene wear but no osteolysis.   Assessment/Plan:  End stage arthritis, right hip  The patient history, physical examination, clinical judgement of the provider and imaging studies are consistent with end stage degenerative joint disease of the right hip and total hip arthroplasty is deemed medically necessary. The treatment options including medical management, injection therapy, arthroscopy and arthroplasty were discussed at length. The risks and benefits of total hip arthroplasty were presented and reviewed. The risks due to aseptic loosening, infection, stiffness, dislocation/subluxation, thromboembolic complications and other imponderables were discussed. The  patient acknowledged the explanation, agreed to proceed with the plan and consent was signed. Patient is being admitted for inpatient treatment for surgery, pain control, PT, OT, prophylactic antibiotics, VTE prophylaxis, progressive ambulation and ADLs and discharge planning.The patient is planning to be discharged  home .   Patient's anticipated LOS is less than 2 midnights, meeting these requirements: - Younger than 49 - Lives within 1 hour of care - Has a competent adult at home to recover with post-op - NO history of  - Chronic pain requiring opioids  - Diabetes  - Coronary Artery Disease  - Heart failure  - Heart attack  - Stroke  - DVT/VTE  - Cardiac arrhythmia  - Respiratory Failure/COPD  - Renal failure  - Anemia  - Advanced Liver disease   Therapy Plans: HEP Disposition: Home with Satira Anis Planned DVT Prophylaxis: Aspirin 325mg   DME Needed: None PCP: Reynold Bowen, MD TXA: IV Allergies: Latex, Adhesives Anesthesia Concerns:  None BMI: 25.8 Last HgbA1c: N/A  Pharmacy: [facility]   - Patient was instructed on what medications to stop prior to surgery. - Follow-up visit in 2 weeks with Dr. Wynelle Link - Begin physical therapy following surgery - Pre-operative lab work as pre-surgical testing - Prescriptions will be provided in hospital at time of discharge  Fenton Foy, [facility], PA-C Orthopedic Surgery [facility] [place]

## 2021-06-12 ENCOUNTER — Observation Stay (HOSPITAL_COMMUNITY): Payer: BC Managed Care – PPO

## 2021-06-12 ENCOUNTER — Encounter (HOSPITAL_COMMUNITY): Payer: Self-pay | Admitting: Orthopedic Surgery

## 2021-06-12 ENCOUNTER — Ambulatory Visit (HOSPITAL_COMMUNITY): Payer: BC Managed Care – PPO | Admitting: Anesthesiology

## 2021-06-12 ENCOUNTER — Observation Stay (HOSPITAL_COMMUNITY)
Admission: RE | Admit: 2021-06-12 | Discharge: 2021-06-12 | Disposition: A | Discharge status: Home / Self Care | Payer: BC Managed Care – PPO | Source: Ambulatory Visit | Attending: Orthopedic Surgery | Admitting: Orthopedic Surgery

## 2021-06-12 ENCOUNTER — Encounter (HOSPITAL_COMMUNITY)
Admission: RE | Disposition: A | Discharge status: Home / Self Care | Payer: Self-pay | Source: Ambulatory Visit | Attending: Orthopedic Surgery

## 2021-06-12 DIAGNOSIS — Z96642 Presence of left artificial hip joint: Secondary | ICD-10-CM | POA: Diagnosis not present

## 2021-06-12 DIAGNOSIS — J449 Chronic obstructive pulmonary disease, unspecified: Secondary | ICD-10-CM | POA: Insufficient documentation

## 2021-06-12 DIAGNOSIS — Z91040 Latex allergy status: Secondary | ICD-10-CM | POA: Diagnosis not present

## 2021-06-12 DIAGNOSIS — Z419 Encounter for procedure for purposes other than remedying health state, unspecified: Secondary | ICD-10-CM

## 2021-06-12 DIAGNOSIS — F17210 Nicotine dependence, cigarettes, uncomplicated: Secondary | ICD-10-CM | POA: Diagnosis not present

## 2021-06-12 DIAGNOSIS — M1611 Unilateral primary osteoarthritis, right hip: Secondary | ICD-10-CM | POA: Diagnosis not present

## 2021-06-12 DIAGNOSIS — F909 Attention-deficit hyperactivity disorder, unspecified type: Secondary | ICD-10-CM | POA: Diagnosis not present

## 2021-06-12 DIAGNOSIS — Z471 Aftercare following joint replacement surgery: Secondary | ICD-10-CM | POA: Diagnosis not present

## 2021-06-12 DIAGNOSIS — Z96641 Presence of right artificial hip joint: Secondary | ICD-10-CM | POA: Diagnosis present

## 2021-06-12 DIAGNOSIS — Z96649 Presence of unspecified artificial hip joint: Secondary | ICD-10-CM

## 2021-06-12 DIAGNOSIS — N419 Inflammatory disease of prostate, unspecified: Secondary | ICD-10-CM | POA: Diagnosis not present

## 2021-06-12 DIAGNOSIS — M169 Osteoarthritis of hip, unspecified: Secondary | ICD-10-CM | POA: Diagnosis present

## 2021-06-12 HISTORY — PX: TOTAL HIP ARTHROPLASTY: SHX124

## 2021-06-12 LAB — TYPE AND SCREEN
ABO/RH(D): O POS
Antibody Screen: NEGATIVE

## 2021-06-12 LAB — ABO/RH: ABO/RH(D): O POS

## 2021-06-12 SURGERY — ARTHROPLASTY, HIP, TOTAL, ANTERIOR APPROACH
Anesthesia: Monitor Anesthesia Care | Site: Hip | Laterality: Right

## 2021-06-12 MED ORDER — HYDROMORPHONE HCL 1 MG/ML IJ SOLN
INTRAMUSCULAR | Status: AC
  Administered 2021-06-12: 0.5 mg via INTRAVENOUS
  Filled 2021-06-12: qty 1

## 2021-06-12 MED ORDER — MIDAZOLAM HCL 5 MG/5ML IJ SOLN
INTRAMUSCULAR | Status: DC | PRN
  Administered 2021-06-12: 2 mg via INTRAVENOUS

## 2021-06-12 MED ORDER — HYDROMORPHONE HCL 1 MG/ML IJ SOLN
0.2500 mg | INTRAMUSCULAR | Status: DC | PRN
  Administered 2021-06-12: 0.5 mg via INTRAVENOUS

## 2021-06-12 MED ORDER — ACETAMINOPHEN 325 MG PO TABS: 325.0000 mg | ORAL_TABLET | Freq: Four times a day (QID) | ORAL | Status: DC | PRN

## 2021-06-12 MED ORDER — OXYCODONE HCL 5 MG PO TABS
5.0000 mg | ORAL_TABLET | Freq: Once | ORAL | Status: AC | PRN
  Administered 2021-06-12: 5 mg via ORAL

## 2021-06-12 MED ORDER — PANTOPRAZOLE SODIUM 40 MG PO TBEC: 40.0000 mg | DELAYED_RELEASE_TABLET | Freq: Every day | ORAL | Status: DC

## 2021-06-12 MED ORDER — ROSUVASTATIN CALCIUM 5 MG PO TABS: 5.0000 mg | ORAL_TABLET | ORAL | Status: DC

## 2021-06-12 MED ORDER — PROPOFOL 10 MG/ML IV BOLUS
INTRAVENOUS | Status: DC | PRN
  Administered 2021-06-12 (×2): 20 mg via INTRAVENOUS

## 2021-06-12 MED ORDER — BISACODYL 10 MG RE SUPP: 10.0000 mg | Freq: Every day | RECTAL | Status: DC | PRN

## 2021-06-12 MED ORDER — KETOROLAC TROMETHAMINE 30 MG/ML IJ SOLN: 30.0000 mg | Freq: Once | INTRAMUSCULAR | Status: AC | PRN

## 2021-06-12 MED ORDER — POLYETHYLENE GLYCOL 3350 17 G PO PACK: 17.0000 g | PACK | Freq: Every day | ORAL | Status: DC | PRN

## 2021-06-12 MED ORDER — ACETAMINOPHEN 325 MG PO TABS: 325.0000 mg | ORAL_TABLET | ORAL | Status: DC | PRN

## 2021-06-12 MED ORDER — HYDROCODONE-ACETAMINOPHEN 5-325 MG PO TABS: 1.0000 | ORAL_TABLET | ORAL | 0 refills | Status: AC | PRN

## 2021-06-12 MED ORDER — LACTATED RINGERS IV SOLN: INTRAVENOUS | Status: DC | PRN

## 2021-06-12 MED ORDER — 0.9 % SODIUM CHLORIDE (POUR BTL) OPTIME
TOPICAL | Status: DC | PRN
  Administered 2021-06-12: 1000 mL

## 2021-06-12 MED ORDER — CEFAZOLIN SODIUM-DEXTROSE 2-4 GM/100ML-% IV SOLN: 2.0000 g | Freq: Four times a day (QID) | INTRAVENOUS | Status: DC

## 2021-06-12 MED ORDER — METHOCARBAMOL 500 MG IVPB - SIMPLE MED
500.0000 mg | Freq: Four times a day (QID) | INTRAVENOUS | Status: DC | PRN
  Administered 2021-06-12: 500 mg via INTRAVENOUS

## 2021-06-12 MED ORDER — FENTANYL CITRATE PF 50 MCG/ML IJ SOSY
PREFILLED_SYRINGE | INTRAMUSCULAR | Status: AC
  Administered 2021-06-12: 25 ug via INTRAVENOUS
  Filled 2021-06-12: qty 1

## 2021-06-12 MED ORDER — POVIDONE-IODINE 10 % EX SWAB
2.0000 "application " | Freq: Once | CUTANEOUS | Status: AC
  Administered 2021-06-12: 2 via TOPICAL

## 2021-06-12 MED ORDER — PROPOFOL 500 MG/50ML IV EMUL
INTRAVENOUS | Status: AC
  Filled 2021-06-12: qty 50

## 2021-06-12 MED ORDER — TRANEXAMIC ACID-NACL 1000-0.7 MG/100ML-% IV SOLN
1000.0000 mg | INTRAVENOUS | Status: DC
  Administered 2021-06-12: 1000 mg via INTRAVENOUS
  Filled 2021-06-12: qty 100

## 2021-06-12 MED ORDER — ORAL CARE MOUTH RINSE: 15.0000 mL | Freq: Once | OROMUCOSAL | Status: AC

## 2021-06-12 MED ORDER — PHENYLEPHRINE HCL-NACL 20-0.9 MG/250ML-% IV SOLN
INTRAVENOUS | Status: DC | PRN
  Administered 2021-06-12: 50 ug/min via INTRAVENOUS

## 2021-06-12 MED ORDER — FENTANYL CITRATE PF 50 MCG/ML IJ SOSY
25.0000 ug | PREFILLED_SYRINGE | INTRAMUSCULAR | Status: DC | PRN
  Administered 2021-06-12: 25 ug via INTRAVENOUS
  Administered 2021-06-12: 50 ug via INTRAVENOUS

## 2021-06-12 MED ORDER — EPHEDRINE 5 MG/ML INJ
INTRAVENOUS | Status: AC
  Filled 2021-06-12: qty 5

## 2021-06-12 MED ORDER — DEXAMETHASONE SODIUM PHOSPHATE 10 MG/ML IJ SOLN: 10.0000 mg | Freq: Once | INTRAMUSCULAR | Status: DC

## 2021-06-12 MED ORDER — MENTHOL 3 MG MT LOZG: 1.0000 | LOZENGE | OROMUCOSAL | Status: DC | PRN

## 2021-06-12 MED ORDER — BUPIVACAINE HCL 0.25 % IJ SOLN
INTRAMUSCULAR | Status: DC | PRN
  Administered 2021-06-12: 30 mL

## 2021-06-12 MED ORDER — WATER FOR IRRIGATION, STERILE IR SOLN
Status: DC | PRN
  Administered 2021-06-12: 2000 mL

## 2021-06-12 MED ORDER — METHOCARBAMOL 500 MG PO TABS: 500.0000 mg | ORAL_TABLET | Freq: Four times a day (QID) | ORAL | Status: DC | PRN

## 2021-06-12 MED ORDER — METHOCARBAMOL 500 MG PO TABS: 500.0000 mg | ORAL_TABLET | Freq: Four times a day (QID) | ORAL | 0 refills | Status: AC | PRN

## 2021-06-12 MED ORDER — LACTATED RINGERS IV SOLN: INTRAVENOUS | Status: DC

## 2021-06-12 MED ORDER — HYDROCODONE-ACETAMINOPHEN 5-325 MG PO TABS: 1.0000 | ORAL_TABLET | ORAL | Status: DC | PRN

## 2021-06-12 MED ORDER — ALBUTEROL SULFATE HFA 108 (90 BASE) MCG/ACT IN AERS: 1.0000 | INHALATION_SPRAY | Freq: Four times a day (QID) | RESPIRATORY_TRACT | Status: DC | PRN

## 2021-06-12 MED ORDER — MEPIVACAINE HCL (PF) 2 % IJ SOLN
INTRAMUSCULAR | Status: DC | PRN
  Administered 2021-06-12: 3.5 mL via INTRATHECAL

## 2021-06-12 MED ORDER — ONDANSETRON HCL 4 MG/2ML IJ SOLN
INTRAMUSCULAR | Status: AC
  Filled 2021-06-12: qty 2

## 2021-06-12 MED ORDER — SODIUM CHLORIDE 0.9 % IV SOLN: INTRAVENOUS | Status: DC

## 2021-06-12 MED ORDER — LACTATED RINGERS IV BOLUS
500.0000 mL | Freq: Once | INTRAVENOUS | Status: AC
  Administered 2021-06-12: 500 mL via INTRAVENOUS

## 2021-06-12 MED ORDER — ONDANSETRON HCL 4 MG PO TABS: 4.0000 mg | ORAL_TABLET | Freq: Four times a day (QID) | ORAL | Status: DC | PRN

## 2021-06-12 MED ORDER — DOCUSATE SODIUM 100 MG PO CAPS: 100.0000 mg | ORAL_CAPSULE | Freq: Two times a day (BID) | ORAL | Status: DC

## 2021-06-12 MED ORDER — CEFAZOLIN SODIUM-DEXTROSE 2-4 GM/100ML-% IV SOLN
2.0000 g | INTRAVENOUS | Status: DC
  Administered 2021-06-12: 2 g via INTRAVENOUS
  Filled 2021-06-12: qty 100

## 2021-06-12 MED ORDER — ONDANSETRON HCL 4 MG/2ML IJ SOLN
INTRAMUSCULAR | Status: DC | PRN
  Administered 2021-06-12: 4 mg via INTRAVENOUS

## 2021-06-12 MED ORDER — FENTANYL CITRATE (PF) 100 MCG/2ML IJ SOLN
INTRAMUSCULAR | Status: AC
  Filled 2021-06-12: qty 2

## 2021-06-12 MED ORDER — OXYCODONE HCL 5 MG/5ML PO SOLN: 5.0000 mg | Freq: Once | ORAL | Status: AC | PRN

## 2021-06-12 MED ORDER — MORPHINE SULFATE (PF) 4 MG/ML IV SOLN: 0.5000 mg | INTRAVENOUS | Status: DC | PRN

## 2021-06-12 MED ORDER — DEXAMETHASONE SODIUM PHOSPHATE 10 MG/ML IJ SOLN: 8.0000 mg | Freq: Once | INTRAMUSCULAR | Status: DC

## 2021-06-12 MED ORDER — MEPERIDINE HCL 50 MG/ML IJ SOLN: 6.2500 mg | INTRAMUSCULAR | Status: DC | PRN

## 2021-06-12 MED ORDER — BUPIVACAINE HCL (PF) 0.25 % IJ SOLN
INTRAMUSCULAR | Status: AC
  Filled 2021-06-12: qty 30

## 2021-06-12 MED ORDER — ONDANSETRON HCL 4 MG/2ML IJ SOLN
4.0000 mg | Freq: Once | INTRAMUSCULAR | Status: DC | PRN
Start: 2021-06-12 — End: 2021-06-12

## 2021-06-12 MED ORDER — OXYCODONE HCL 5 MG PO TABS
ORAL_TABLET | ORAL | Status: AC
  Filled 2021-06-12: qty 1

## 2021-06-12 MED ORDER — ASPIRIN EC 325 MG PO TBEC: 325.0000 mg | DELAYED_RELEASE_TABLET | Freq: Two times a day (BID) | ORAL | Status: DC

## 2021-06-12 MED ORDER — PROPOFOL 10 MG/ML IV BOLUS
INTRAVENOUS | Status: AC
  Filled 2021-06-12: qty 20

## 2021-06-12 MED ORDER — ONDANSETRON HCL 4 MG/2ML IJ SOLN: 4.0000 mg | Freq: Four times a day (QID) | INTRAMUSCULAR | Status: DC | PRN

## 2021-06-12 MED ORDER — DEXAMETHASONE SODIUM PHOSPHATE 10 MG/ML IJ SOLN
INTRAMUSCULAR | Status: AC
  Filled 2021-06-12: qty 1

## 2021-06-12 MED ORDER — MIDAZOLAM HCL 2 MG/2ML IJ SOLN
INTRAMUSCULAR | Status: AC
  Filled 2021-06-12: qty 2

## 2021-06-12 MED ORDER — FENTANYL CITRATE (PF) 100 MCG/2ML IJ SOLN
INTRAMUSCULAR | Status: DC | PRN
  Administered 2021-06-12: 100 ug via INTRAVENOUS

## 2021-06-12 MED ORDER — METOCLOPRAMIDE HCL 5 MG/ML IJ SOLN: 5.0000 mg | Freq: Three times a day (TID) | INTRAMUSCULAR | Status: DC | PRN

## 2021-06-12 MED ORDER — TRAMADOL HCL 50 MG PO TABS: 50.0000 mg | ORAL_TABLET | Freq: Four times a day (QID) | ORAL | Status: DC | PRN

## 2021-06-12 MED ORDER — METHOCARBAMOL 500 MG IVPB - SIMPLE MED: 500.0000 mg | Freq: Four times a day (QID) | INTRAVENOUS | Status: DC | PRN

## 2021-06-12 MED ORDER — FENTANYL CITRATE PF 50 MCG/ML IJ SOSY
PREFILLED_SYRINGE | INTRAMUSCULAR | Status: AC
  Administered 2021-06-12: 50 ug via INTRAVENOUS
  Filled 2021-06-12: qty 1

## 2021-06-12 MED ORDER — PHENOL 1.4 % MT LIQD: 1.0000 | OROMUCOSAL | Status: DC | PRN

## 2021-06-12 MED ORDER — METHOCARBAMOL 500 MG IVPB - SIMPLE MED
INTRAVENOUS | Status: AC
  Filled 2021-06-12: qty 50

## 2021-06-12 MED ORDER — PROPOFOL 500 MG/50ML IV EMUL
INTRAVENOUS | Status: DC | PRN
  Administered 2021-06-12: 50 ug/kg/min via INTRAVENOUS

## 2021-06-12 MED ORDER — CHLORHEXIDINE GLUCONATE 0.12 % MT SOLN
15.0000 mL | Freq: Once | OROMUCOSAL | Status: AC
  Administered 2021-06-12: 15 mL via OROMUCOSAL

## 2021-06-12 MED ORDER — TRAMADOL HCL 50 MG PO TABS: 50.0000 mg | ORAL_TABLET | Freq: Four times a day (QID) | ORAL | 0 refills | Status: AC | PRN

## 2021-06-12 MED ORDER — ACETAMINOPHEN 10 MG/ML IV SOLN
1000.0000 mg | Freq: Four times a day (QID) | INTRAVENOUS | Status: DC
  Administered 2021-06-12: 1000 mg via INTRAVENOUS
  Filled 2021-06-12: qty 100

## 2021-06-12 MED ORDER — LACTATED RINGERS IV BOLUS
250.0000 mL | Freq: Once | INTRAVENOUS | Status: AC
  Administered 2021-06-12: 250 mL via INTRAVENOUS

## 2021-06-12 MED ORDER — ACETAMINOPHEN 160 MG/5ML PO SOLN: 325.0000 mg | ORAL | Status: DC | PRN

## 2021-06-12 MED ORDER — KETOROLAC TROMETHAMINE 30 MG/ML IJ SOLN
INTRAMUSCULAR | Status: AC
  Administered 2021-06-12: 30 mg via INTRAVENOUS
  Filled 2021-06-12: qty 1

## 2021-06-12 MED ORDER — MEPIVACAINE HCL (PF) 2 % IJ SOLN
INTRAMUSCULAR | Status: AC
  Filled 2021-06-12: qty 20

## 2021-06-12 MED ORDER — EPHEDRINE SULFATE-NACL 50-0.9 MG/10ML-% IV SOSY
PREFILLED_SYRINGE | INTRAVENOUS | Status: DC | PRN
  Administered 2021-06-12 (×2): 10 mg via INTRAVENOUS

## 2021-06-12 MED ORDER — ASPIRIN 325 MG PO TBEC: 325.0000 mg | DELAYED_RELEASE_TABLET | Freq: Two times a day (BID) | ORAL | 0 refills | Status: AC

## 2021-06-12 MED ORDER — FENTANYL CITRATE PF 50 MCG/ML IJ SOSY
PREFILLED_SYRINGE | INTRAMUSCULAR | Status: AC
  Filled 2021-06-12: qty 1

## 2021-06-12 MED ORDER — DEXAMETHASONE SODIUM PHOSPHATE 10 MG/ML IJ SOLN
INTRAMUSCULAR | Status: DC | PRN
  Administered 2021-06-12: 10 mg via INTRAVENOUS

## 2021-06-12 MED ORDER — METOCLOPRAMIDE HCL 5 MG PO TABS: 5.0000 mg | ORAL_TABLET | Freq: Three times a day (TID) | ORAL | Status: DC | PRN

## 2021-06-12 SURGICAL SUPPLY — 44 items
BAG COUNTER SPONGE SURGICOUNT (BAG) IMPLANT
BAG DECANTER FOR FLEXI CONT (MISCELLANEOUS) IMPLANT
BAG SPEC THK2 15X12 ZIP CLS (MISCELLANEOUS)
BAG SPNG CNTER NS LX DISP (BAG)
BAG ZIPLOCK 12X15 (MISCELLANEOUS) IMPLANT
BLADE SAG 18X100X1.27 (BLADE) ×2 IMPLANT
COVER PERINEAL POST (MISCELLANEOUS) ×2 IMPLANT
COVER SURGICAL LIGHT HANDLE (MISCELLANEOUS) ×2 IMPLANT
CUP ACETBLR 54 OD PINNACLE (Hips) ×1 IMPLANT
DECANTER SPIKE VIAL GLASS SM (MISCELLANEOUS) ×2 IMPLANT
DRAPE FOOT SWITCH (DRAPES) ×2 IMPLANT
DRAPE STERI IOBAN 125X83 (DRAPES) ×2 IMPLANT
DRAPE U-SHAPE 47X51 STRL (DRAPES) ×4 IMPLANT
DRSG AQUACEL AG ADV 3.5X10 (GAUZE/BANDAGES/DRESSINGS) ×2 IMPLANT
DURAPREP 26ML APPLICATOR (WOUND CARE) ×2 IMPLANT
ELECT REM PT RETURN 15FT ADLT (MISCELLANEOUS) ×2 IMPLANT
GLOVE SRG 8 PF TXTR STRL LF DI (GLOVE) ×1 IMPLANT
GLOVE SURG ENC MOIS LTX SZ6.5 (GLOVE) ×2 IMPLANT
GLOVE SURG ENC MOIS LTX SZ7 (GLOVE) ×2 IMPLANT
GLOVE SURG ENC MOIS LTX SZ8 (GLOVE) ×4 IMPLANT
GLOVE SURG UNDER POLY LF SZ7 (GLOVE) ×2 IMPLANT
GLOVE SURG UNDER POLY LF SZ8 (GLOVE) ×2
GLOVE SURG UNDER POLY LF SZ8.5 (GLOVE) IMPLANT
GOWN STRL REUS W/TWL LRG LVL3 (GOWN DISPOSABLE) ×4 IMPLANT
GOWN STRL REUS W/TWL XL LVL3 (GOWN DISPOSABLE) IMPLANT
HEAD CERAMIC 36 PLUS 8.5 12 14 (Hips) ×1 IMPLANT
HOLDER FOLEY CATH W/STRAP (MISCELLANEOUS) ×2 IMPLANT
KIT TURNOVER KIT A (KITS) IMPLANT
LINER MARATHON NEUT +4X54X36 (Hips) ×1 IMPLANT
MANIFOLD NEPTUNE II (INSTRUMENTS) ×2 IMPLANT
PACK ANTERIOR HIP CUSTOM (KITS) ×2 IMPLANT
PENCIL SMOKE EVACUATOR COATED (MISCELLANEOUS) ×2 IMPLANT
STEM FEM ACTIS HIGH SZ7 (Stem) ×1 IMPLANT
STRIP CLOSURE SKIN 1/2X4 (GAUZE/BANDAGES/DRESSINGS) ×2 IMPLANT
SUT ETHIBOND NAB CT1 #1 30IN (SUTURE) ×2 IMPLANT
SUT MNCRL AB 4-0 PS2 18 (SUTURE) ×2 IMPLANT
SUT STRATAFIX 0 PDS 27 VIOLET (SUTURE) ×2
SUT VIC AB 2-0 CT1 27 (SUTURE) ×4
SUT VIC AB 2-0 CT1 TAPERPNT 27 (SUTURE) ×2 IMPLANT
SUTURE STRATFX 0 PDS 27 VIOLET (SUTURE) ×1 IMPLANT
SYR 50ML LL SCALE MARK (SYRINGE) IMPLANT
TAPE STRIPS DRAPE STRL (GAUZE/BANDAGES/DRESSINGS) ×1 IMPLANT
TRAY FOLEY MTR SLVR 16FR STAT (SET/KITS/TRAYS/PACK) ×2 IMPLANT
TUBE SUCTION HIGH CAP CLEAR NV (SUCTIONS) ×2 IMPLANT

## 2021-06-12 NOTE — Anesthesia Postprocedure Evaluation (Signed)
Anesthesia Post Note  Patient: Corey Arnold  Procedure(s) Performed: TOTAL HIP ARTHROPLASTY ANTERIOR APPROACH (Right: Hip)     Patient location during evaluation: PACU Anesthesia Type: MAC Level of consciousness: oriented and awake and alert Pain management: pain level controlled Vital Signs Assessment: post-procedure vital signs reviewed and stable Respiratory status: spontaneous breathing, respiratory function stable and patient connected to nasal cannula oxygen Cardiovascular status: blood pressure returned to baseline and stable Postop Assessment: no headache, no backache and no apparent nausea or vomiting Anesthetic complications: no   No notable events documented.  Last Vitals:  Vitals:   06/12/21 0846  BP: 128/88  Pulse: 61  Resp: 16  Temp: 36.6 C  SpO2: 96%    Last Pain:  Vitals:   06/12/21 0846  TempSrc: Oral  PainSc: 0-No pain                 Jilliane Kazanjian

## 2021-06-12 NOTE — Evaluation (Signed)
Physical Therapy Evaluation Patient Details Name: Corey Arnold MRN: [id number] DOB: 04-09-1956 Today's Date: 06/12/2021  History of Present Illness  Patient is 65 y.o. male s/p Rt THA anterior approach on 06/12/21 with PMH significant for ADHD, osteopenia, C4-6 fusion, Lt THA and reduction due to dislocation.   Clinical Impression  Corey Arnold is a 65 y.o. male POD 0 s/p Rt THA. Patient reports independence with mobility at baseline. Patient is now limited by functional impairments (see PT problem list below) and requires min guard/supervision for transfers and gait with RW. Patient was able to ambulate ~120 feet with RW and min guard/supervision and cues for safe walker management. Patient educated on safe sequencing for stair mobility and verbalized safe guarding position for people assisting with mobility. Patient instructed in exercises to facilitate ROM and circulation. Patient will benefit from continued skilled PT interventions to address impairments and progress towards PLOF. Patient has met mobility goals at adequate level for discharge home; will continue to follow if pt continues acute stay to progress towards Mod I goals.        Recommendations for follow up therapy are one component of a multi-disciplinary discharge planning process, led by the attending physician.  Recommendations may be updated based on patient status, additional functional criteria and insurance authorization.  Follow Up Recommendations Follow physician's recommendations for discharge plan and follow up therapies    Assistance Recommended at Discharge Intermittent Supervision/Assistance  Functional Status Assessment Patient has had a recent decline in their functional status and demonstrates the ability to make significant improvements in function in a reasonable and predictable amount of time.  Equipment Recommendations  None recommended by PT    Recommendations for Other Services       Precautions /  Restrictions Precautions Precautions: Fall Restrictions Weight Bearing Restrictions: No Other Position/Activity Restrictions: WBAT      Mobility  Bed Mobility Overal bed mobility: Needs Assistance Bed Mobility: Supine to Sit;Sit to Supine     Supine to sit: Min guard;Supervision Sit to supine: Min guard;Supervision   General bed mobility comments: pt takign extra time to pivot LE's on/off EOB and cues needed to use belt for Rt LE coming onto bed.    Transfers Overall transfer level: Needs assistance Equipment used: Rolling walker (2 wheels) Transfers: Sit to/from Stand Sit to Stand: Min guard;Supervision;From elevated surface           General transfer comment: cues for safe hand placement/technique with RW, no overt LOB noted. pt    Ambulation/Gait Ambulation/Gait assistance: Min guard;Supervision Gait Distance (Feet): 120 Feet Assistive device: Rolling walker (2 wheels) Gait Pattern/deviations: Step-to pattern;Step-through pattern;Decreased stride length;Decreased weight shift to right Gait velocity: fair   General Gait Details: cues for safe step pattern and proximity to RW. pt improved walker position throughout as he moved to step through pattern.  Stairs Stairs: Yes Stairs assistance: Min guard;Supervision Stair Management: Sideways;Step to pattern;One rail Right;Forwards;With walker Number of Stairs: 3 General stair comments: cues for sequencing step "up with good, down with bad" and use of rail for side step pattern. educated on curb negotiation with RW. no overt LOB noted, guarding/supervision for safety.  Wheelchair Mobility    Modified Rankin (Stroke Patients Only)       Balance Overall balance assessment: Needs assistance Sitting-balance support: Feet supported Sitting balance-Leahy Scale: Good     Standing balance support: During functional activity;Bilateral upper extremity supported;Reliant on assistive device for balance Standing  balance-Leahy Scale: Fair  Pertinent Vitals/Pain Pain Assessment: 0-10 Pain Score: 3  Pain Location: Rt hip Pain Descriptors / Indicators: Aching;Burning;Discomfort Pain Intervention(s): Limited activity within patient's tolerance;Monitored during session;Repositioned    Home Living Family/patient expects to be discharged to:: Private residence Living Arrangements: Non-relatives/Friends Available Help at Discharge: Family Type of Home: House Home Access: Stairs to enter Entrance Stairs-Rails: Psychiatric nurse of Steps: 1+1   Home Layout: One level Home Equipment: Conservation officer, nature (2 wheels);Cane - single point      Prior Function Prior Level of Function : Independent/Modified Independent                     Hand Dominance   Dominant Hand:  (mixed preference)    Extremity/Trunk Assessment   Upper Extremity Assessment Upper Extremity Assessment: Overall WFL for tasks assessed    Lower Extremity Assessment Lower Extremity Assessment: Overall WFL for tasks assessed    Cervical / Trunk Assessment Cervical / Trunk Assessment: Normal  Communication   Communication: No difficulties  Cognition Arousal/Alertness: Awake/alert Behavior During Therapy: WFL for tasks assessed/performed Overall Cognitive Status: Within Functional Limits for tasks assessed                                          General Comments      Exercises Total Joint Exercises Ankle Circles/Pumps: AROM;Both;20 reps;Supine Quad Sets: AROM;Both;5 reps;Supine Short Arc Quad: AROM;Right;Supine;Other reps (comment) Heel Slides: AROM;Right;Supine;Other reps (comment) Hip ABduction/ADduction: AROM;Right;Supine;Other reps (comment)   Assessment/Plan    PT Assessment Patient needs continued PT services  PT Problem List Decreased strength;Decreased range of motion;Decreased activity tolerance;Decreased balance;Decreased  mobility;Decreased knowledge of use of DME;Decreased knowledge of precautions;Pain       PT Treatment Interventions DME instruction;Gait training;Stair training;Functional mobility training;Therapeutic activities;Therapeutic exercise;Balance training;Patient/family education    PT Goals (Current goals can be found in the Care Plan section)  Acute Rehab PT Goals Patient Stated Goal: get back to SUP PT Goal Formulation: With patient Time For Goal Achievement: 06/19/21 Potential to Achieve Goals: Good    Frequency 7X/week   Barriers to discharge        Co-evaluation               AM-PAC PT "6 Clicks" Mobility  Outcome Measure Help needed turning from your back to your side while in a flat bed without using bedrails?: A Little Help needed moving from lying on your back to sitting on the side of a flat bed without using bedrails?: A Little Help needed moving to and from a bed to a chair (including a wheelchair)?: A Little Help needed standing up from a chair using your arms (e.g., wheelchair or bedside chair)?: A Little Help needed to walk in hospital room?: A Little Help needed climbing 3-5 steps with a railing? : A Little 6 Click Score: 18    End of Session Equipment Utilized During Treatment: Gait belt Activity Tolerance: Patient tolerated treatment well Patient left: in chair;with call bell/phone within reach;with chair alarm set;with family/visitor present Nurse Communication: Mobility status PT Visit Diagnosis: Muscle weakness (generalized) (M62.81);Difficulty in walking, not elsewhere classified (R26.2)    Time: 4967-5916 PT Time Calculation (min) (ACUTE ONLY): 27 min   Charges:   PT Evaluation $PT Eval Low Complexity: 1 Low PT Treatments $Gait Training: 8-22 mins        Verner Mould, DPT Acute Rehabilitation Services Office [phone]  Pager [phone]   Jacques Navy 06/12/2021, 3:32 PM

## 2021-06-12 NOTE — Op Note (Signed)
OPERATIVE REPORT- TOTAL HIP ARTHROPLASTY   PREOPERATIVE DIAGNOSIS: Osteoarthritis of the Right hip.   POSTOPERATIVE DIAGNOSIS: Osteoarthritis of the Right  hip.   PROCEDURE: Right total hip arthroplasty, anterior approach.   SURGEON: Gaynelle Arabian, MD   ASSISTANT: Theresa Duty, PA-C  ANESTHESIA:  Spinal  ESTIMATED BLOOD LOSS:-750 mL    DRAINS: Hemovac x1.   COMPLICATIONS: None   CONDITION: PACU - hemodynamically stable.   BRIEF CLINICAL NOTE: Corey Arnold is a 65 y.o. male who has advanced end-  stage arthritis of their Right  hip with progressively worsening pain and  dysfunction.The patient has failed nonoperative management and presents for  total hip arthroplasty.   PROCEDURE IN DETAIL: After successful administration of spinal  anesthetic, the traction boots for the [facility] bed were placed on both  feet and the patient was placed onto the [facility] bed, boots placed into the leg  holders. The Right hip was then isolated from the perineum with plastic  drapes and prepped and draped in the usual sterile fashion. ASIS and  greater trochanter were marked and a oblique incision was made, starting  at about 1 cm lateral and 2 cm distal to the ASIS and coursing towards  the anterior cortex of the femur. The skin was cut with a 10 blade  through subcutaneous tissue to the level of the fascia overlying the  tensor fascia lata muscle. The fascia was then incised in line with the  incision at the junction of the anterior third and posterior 2/3rd. The  muscle was teased off the fascia and then the interval between the TFL  and the rectus was developed. The Hohmann retractor was then placed at  the top of the femoral neck over the capsule. The vessels overlying the  capsule were cauterized and the fat on top of the capsule was removed.  A Hohmann retractor was then placed anterior underneath the rectus  femoris to give exposure to the entire anterior capsule. A T-shaped   capsulotomy was performed. The edges were tagged and the femoral head  was identified.       Osteophytes are removed off the superior acetabulum.  The femoral neck was then cut in situ with an oscillating saw. Traction  was then applied to the left lower extremity utilizing the [facility]  traction. The femoral head was then removed. Retractors were placed  around the acetabulum and then circumferential removal of the labrum was  performed. Osteophytes were also removed. Reaming starts at 49 mm to  medialize and  Increased in 2 mm increments to 53 mm. We reamed in  approximately 40 degrees of abduction, 20 degrees anteversion. A 54 mm  pinnacle acetabular shell was then impacted in anatomic position under  fluoroscopic guidance with excellent purchase. We did not need to place  any additional dome screws. A 36 mm neutral + 4 marathon liner was then  placed into the acetabular shell.       The femoral lift was then placed along the lateral aspect of the femur  just distal to the vastus ridge. The leg was  externally rotated and capsule  was stripped off the inferior aspect of the femoral neck down to the  level of the lesser trochanter, this was done with electrocautery. The femur was lifted after this was performed. The  leg was then placed in an extended and adducted position essentially delivering the femur. We also removed the capsule superiorly and the piriformis from the piriformis  fossa to gain excellent exposure of the  proximal femur. Rongeur was used to remove some cancellous bone to get  into the lateral portion of the proximal femur for placement of the  initial starter reamer. The starter broaches was placed  the starter broach  and was shown to go down the center of the canal. Broaching  with the Actis system was then performed starting at size 0  coursing  Up to size 7. A size 7 had excellent torsional and rotational  and axial stability. The trial high offset neck was then placed   with a 36 + 8.5 trial head. The hip was then reduced. We confirmed that  the stem was in the canal both on AP and lateral x-rays. It also has excellent sizing. The hip was reduced with outstanding stability through full extension and full external rotation.. AP pelvis was taken and the leg lengths were measured and found to be equal. Hip was then dislocated again and the femoral head and neck removed. The  femoral broach was removed. Size 7 Actis stem with a high offset  neck was then impacted into the femur following native anteversion. Has  excellent purchase in the canal. Excellent torsional and rotational and  axial stability. It is confirmed to be in the canal on AP and lateral  fluoroscopic views. The 36 + 8.5 ceramic head was placed and the hip  reduced with outstanding stability. Again AP pelvis was taken and it  confirmed that the leg lengths were equal. The wound was then copiously  irrigated with saline solution and the capsule reattached and repaired  with Ethibond suture. 30 ml of .25% Bupivicaine was  injected into the capsule and into the edge of the tensor fascia lata as well as subcutaneous tissue. The fascia overlying the tensor fascia lata was then closed with a running #1 V-Loc. Subcu was closed with interrupted 2-0 Vicryl and subcuticular running 4-0 Monocryl. Incision was cleaned  and dried. Steri-Strips and a bulky sterile dressing applied. The patient was awakened and transported to  recovery in stable condition.        Please note that a surgical assistant was a medical necessity for this procedure to perform it in a safe and expeditious manner. Assistant was necessary to provide appropriate retraction of vital neurovascular structures and to prevent femoral fracture and allow for anatomic placement of the prosthesis.  Gaynelle Arabian, M.D.

## 2021-06-12 NOTE — Anesthesia Preprocedure Evaluation (Signed)
Anesthesia Evaluation  Patient identified by MRN, date of birth, ID band Patient awake    Reviewed: Allergy & Precautions, H&P , NPO status , Patient's Chart, lab work & pertinent test results, reviewed documented beta blocker date and time   Airway Mallampati: II  TM Distance: >3 FB Neck ROM: full    Dental no notable dental hx.    Pulmonary COPD,  COPD inhaler, Current Smoker,    Pulmonary exam normal breath sounds clear to auscultation       Cardiovascular Exercise Tolerance: Good negative cardio ROS   Rhythm:regular Rate:Normal     Neuro/Psych  Neuromuscular disease negative psych ROS   GI/Hepatic negative GI ROS, (+)     substance abuse  ,   Endo/Other  negative endocrine ROS  Renal/GU negative Renal ROS  negative genitourinary   Musculoskeletal  (+) Arthritis , Osteoarthritis,    Abdominal   Peds  Hematology negative hematology ROS (+)   Anesthesia Other Findings   Reproductive/Obstetrics negative OB ROS                             Anesthesia Physical Anesthesia Plan  ASA: 3  Anesthesia Plan: MAC and Spinal   Post-op Pain Management:    Induction:   PONV Risk Score and Plan: 2 and Propofol infusion  Airway Management Planned: Natural Airway, Nasal Cannula and Simple Face Mask  Additional Equipment:   Intra-op Plan:   Post-operative Plan:   Informed Consent: I have reviewed the patients History and Physical, chart, labs and discussed the procedure including the risks, benefits and alternatives for the proposed anesthesia with the patient or authorized representative who has indicated his/her understanding and acceptance.     Dental Advisory Given  Plan Discussed with: CRNA and Anesthesiologist  Anesthesia Plan Comments: (  )        Anesthesia Quick Evaluation

## 2021-06-12 NOTE — Discharge Instructions (Signed)
°Frank Aluisio, MD °Total Joint Specialist °[facility] °[address] °[place], [place] [address] °[phone] ° °ANTERIOR APPROACH TOTAL HIP REPLACEMENT POSTOPERATIVE DIRECTIONS ° ° ° ° °Hip Rehabilitation, Guidelines Following Surgery  °The results of a hip operation are greatly improved after range of motion and muscle strengthening exercises. Follow all safety measures which are given to protect your hip. If any of these exercises cause increased pain or swelling in your joint, decrease the amount until you are comfortable again. Then slowly increase the exercises. Call your caregiver if you have problems or questions.  ° °BLOOD CLOT PREVENTION °Take a 325 mg Aspirin two times a day for three weeks following surgery. Then take an 81 mg Aspirin once a day for three weeks. Then discontinue Aspirin. °You may resume your vitamins/supplements upon discharge from the hospital. °Do not take any NSAIDs (Advil, Aleve, Ibuprofen, Meloxicam, etc.) until you have discontinued the 325 mg Aspirin. ° °HOME CARE INSTRUCTIONS  °Remove items at home which could result in a fall. This includes throw rugs or furniture in walking pathways.  °ICE to the affected hip as frequently as 20-30 minutes an hour and then as needed for pain and swelling. Continue to use ice on the hip for pain and swelling from surgery. You may notice swelling that will progress down to the foot and ankle. This is normal after surgery. Elevate the leg when you are not up walking on it.   °Continue to use the breathing machine which will help keep your temperature down.  It is common for your temperature to cycle up and down following surgery, especially at night when you are not up moving around and exerting yourself.  The breathing machine keeps your lungs expanded and your temperature down. ° °DIET °You may resume your previous home diet once your are discharged from the hospital. ° °DRESSING / WOUND CARE / SHOWERING °You have  an adhesive waterproof bandage over the incision. Leave this in place until your first follow-up appointment. Once you remove this you will not need to place another bandage.  °You may begin showering 3 days following surgery, but do not submerge the incision under water. ° °ACTIVITY °For the first 3-5 days, it is important to rest and keep the operative leg elevated. You should, as a general rule, rest for 50 minutes and walk/stretch for 10 minutes per hour. After 5 days, you may slowly increase activity as tolerated.  °Perform the exercises you were provided twice a day for about 15-20 minutes each session. Begin these 2 days following surgery. °Walk with your walker as instructed. Use the walker until you are comfortable transitioning to a cane. Walk with the cane in the opposite hand of the operative leg. You may discontinue the cane once you are comfortable and walking steadily. °Avoid periods of inactivity such as sitting longer than an hour when not asleep. This helps prevent blood clots.  °Do not drive a car for 6 weeks or until released by your surgeon.  °Do not drive while taking narcotics. ° °TED HOSE STOCKINGS °Wear the elastic stockings on both legs for three weeks following surgery during the day. You may remove them at night while sleeping. ° °WEIGHT BEARING °Weight bearing as tolerated with assist device (walker, cane, etc) as directed, use it as long as suggested by your surgeon or therapist, typically at least 4-6 weeks. ° °POSTOPERATIVE CONSTIPATION PROTOCOL °Constipation - defined medically as fewer than three stools per week and severe constipation as   less than one stool per week. ° °One of the most common issues patients have following surgery is constipation.  Even if you have a regular bowel pattern at home, your normal regimen is likely to be disrupted due to multiple reasons following surgery.  Combination of anesthesia, postoperative narcotics, change in appetite and fluid intake all can  affect your bowels.  In order to avoid complications following surgery, here are some recommendations in order to help you during your recovery period. ° °Colace (docusate) - Pick up an over-the-counter form of Colace or another stool softener and take twice a day as long as you are requiring postoperative pain medications.  Take with a full glass of water daily.  If you experience loose stools or diarrhea, hold the colace until you stool forms back up.  If your symptoms do not get better within 1 week or if they get worse, check with your doctor. °Dulcolax (bisacodyl) - Pick up over-the-counter and take as directed by the product packaging as needed to assist with the movement of your bowels.  Take with a full glass of water.  Use this product as needed if not relieved by Colace only.  °MiraLax (polyethylene glycol) - Pick up over-the-counter to have on hand.  MiraLax is a solution that will increase the amount of water in your bowels to assist with bowel movements.  Take as directed and can mix with a glass of water, juice, soda, coffee, or tea.  Take if you go more than two days without a movement.Do not use MiraLax more than once per day. Call your doctor if you are still constipated or irregular after using this medication for 7 days in a row. ° °If you continue to have problems with postoperative constipation, please contact the office for further assistance and recommendations.  If you experience "the worst abdominal pain ever" or develop nausea or vomiting, please contact the office immediatly for further recommendations for treatment. ° °ITCHING ° If you experience itching with your medications, try taking only a single pain pill, or even half a pain pill at a time.  You can also use Benadryl over the counter for itching or also to help with sleep.  ° °MEDICATIONS °See your medication summary on the “After Visit Summary” that the nursing staff will review with you prior to discharge.  You may have some home  medications which will be placed on hold until you complete the course of blood thinner medication.  It is important for you to complete the blood thinner medication as prescribed by your surgeon.  Continue your approved medications as instructed at time of discharge. ° °PRECAUTIONS °If you experience chest pain or shortness of breath - call 911 immediately for transfer to the hospital emergency department.  °If you develop a fever greater that 101 F, purulent drainage from wound, increased redness or drainage from wound, foul odor from the wound/dressing, or calf pain - CONTACT YOUR SURGEON.   °                                                °FOLLOW-UP APPOINTMENTS °Make sure you keep all of your appointments after your operation with your surgeon and caregivers. You should call the office at the above phone number and make an appointment for approximately two weeks after the date of your surgery or on the   date instructed by your surgeon outlined in the "After Visit Summary". ° °RANGE OF MOTION AND STRENGTHENING EXERCISES  °These exercises are designed to help you keep full movement of your hip joint. Follow your caregiver's or physical therapist's instructions. Perform all exercises about fifteen times, three times per day or as directed. Exercise both hips, even if you have had only one joint replacement. These exercises can be done on a training (exercise) mat, on the floor, on a table or on a bed. Use whatever works the best and is most comfortable for you. Use music or television while you are exercising so that the exercises are a pleasant break in your day. This will make your life better with the exercises acting as a break in routine you can look forward to.  °Lying on your back, slowly slide your foot toward your buttocks, raising your knee up off the floor. Then slowly slide your foot back down until your leg is straight again.  °Lying on your back spread your legs as far apart as you can without causing  discomfort.  °Lying on your side, raise your upper leg and foot straight up from the floor as far as is comfortable. Slowly lower the leg and repeat.  °Lying on your back, tighten up the muscle in the front of your thigh (quadriceps muscles). You can do this by keeping your leg straight and trying to raise your heel off the floor. This helps strengthen the largest muscle supporting your knee.  °Lying on your back, tighten up the muscles of your buttocks both with the legs straight and with the knee bent at a comfortable angle while keeping your heel on the floor.  ° °POST-OPERATIVE OPIOID TAPER INSTRUCTIONS: °It is important to wean off of your opioid medication as soon as possible. If you do not need pain medication after your surgery it is ok to stop day one. °Opioids include: °Codeine, Hydrocodone(Norco, Vicodin), Oxycodone(Percocet, oxycontin) and hydromorphone amongst others.  °Long term and even short term use of opiods can cause: °Increased pain response °Dependence °Constipation °Depression °Respiratory depression °And more.  °Withdrawal symptoms can include °Flu like symptoms °Nausea, vomiting °And more °Techniques to manage these symptoms °Hydrate well °Eat regular healthy meals °Stay active °Use relaxation techniques(deep breathing, meditating, yoga) °Do Not substitute Alcohol to help with tapering °If you have been on opioids for less than two weeks and do not have pain than it is ok to stop all together.  °Plan to wean off of opioids °This plan should start within one week post op of your joint replacement. °Maintain the same interval or time between taking each dose and first decrease the dose.  °Cut the total daily intake of opioids by one tablet each day °Next start to increase the time between doses. °The last dose that should be eliminated is the evening dose.  ° °IF YOU ARE TRANSFERRED TO A SKILLED REHAB FACILITY °If the patient is transferred to a skilled rehab facility following release from the  hospital, a list of the current medications will be sent to the facility for the patient to continue.  When discharged from the skilled rehab facility, please have the facility set up the patient's Home Health Physical Therapy prior to being released. Also, the skilled facility will be responsible for providing the patient with their medications at time of release from the facility to include their pain medication, the muscle relaxants, and their blood thinner medication. If the patient is still at the rehab facility   at time of the two week follow up appointment, the skilled rehab facility will also need to assist the patient in arranging follow up appointment in our office and any transportation needs. ° °MAKE SURE YOU:  °Understand these instructions.  °Get help right away if you are not doing well or get worse.  ° ° °DENTAL ANTIBIOTICS: ° °In most cases prophylactic antibiotics for Dental procdeures after total joint surgery are not necessary. ° °Exceptions are as follows: ° °1. History of prior total joint infection ° °2. Severely immunocompromised (Organ Transplant, cancer chemotherapy, Rheumatoid biologic °meds such as Humera) ° °3. Poorly controlled diabetes (A1C &gt; 8.0, blood glucose over 200) ° °If you have one of these conditions, contact your surgeon for an antibiotic prescription, prior to your °dental procedure.  ° ° °Pick up stool softner and laxative for home use following surgery while on pain medications. °Do not submerge incision under water. °Please use good hand washing techniques while changing dressing each day. °May shower starting three days after surgery. °Please use a clean towel to pat the incision dry following showers. °Continue to use ice for pain and swelling after surgery. °Do not use any lotions or creams on the incision until instructed by your surgeon. ° °

## 2021-06-12 NOTE — Transfer of Care (Signed)
Immediate Anesthesia Transfer of Care Note  Patient: EGE MUCKEY  Procedure(s) Performed: TOTAL HIP ARTHROPLASTY ANTERIOR APPROACH (Right: Hip)  Patient Location: PACU  Anesthesia Type:Spinal  Level of Consciousness: awake, alert  and oriented  Airway & Oxygen Therapy: Patient Spontanous Breathing and Patient connected to face mask oxygen  Post-op Assessment: Report given to RN and Post -op Vital signs reviewed and stable  Post vital signs: Reviewed and stable  Last Vitals:  Vitals Value Taken Time  BP 90/67 06/12/21 1141  Temp    Pulse 54 06/12/21 1143  Resp 8 06/12/21 1143  SpO2 100 % 06/12/21 1143  Vitals shown include unvalidated device data.  Last Pain:  Vitals:   06/12/21 0846  TempSrc: Oral  PainSc: 0-No pain         Complications: No notable events documented.

## 2021-06-12 NOTE — Interval H&P Note (Signed)
History and Physical Interval Note:  06/12/2021 8:59 AM  Corey Arnold  has presented today for surgery, with the diagnosis of right hip osteoarthritis.  The various methods of treatment have been discussed with the patient and family. After consideration of risks, benefits and other options for treatment, the patient has consented to  Procedure(s): TOTAL HIP ARTHROPLASTY ANTERIOR APPROACH (Right) as a surgical intervention.  The patient's history has been reviewed, patient examined, no change in status, stable for surgery.  I have reviewed the patient's chart and labs.  Questions were answered to the patient's satisfaction.     Pilar Plate Calix Heinbaugh

## 2021-06-12 NOTE — Anesthesia Procedure Notes (Signed)
Spinal  Patient location during procedure: OR Start time: 06/12/2021 10:11 AM End time: 06/12/2021 10:15 AM Reason for block: surgical anesthesia Staffing Performed: resident/CRNA  Resident/CRNA: Sharlette Dense, CRNA Preanesthetic Checklist Completed: patient identified, IV checked, site marked, risks and benefits discussed, surgical consent, monitors and equipment checked, pre-op evaluation and timeout performed Spinal Block Patient position: sitting Prep: DuraPrep and site prepped and draped Patient monitoring: heart rate, continuous pulse ox and blood pressure Approach: midline Location: L3-4 Injection technique: single-shot Needle Needle type: Pencan  Needle gauge: 24 G Needle length: 9 cm Additional Notes Kit expiration 10/15/2021 and lot # [id number] Clear CSF, negative heme, negative paresthesia Tolerated well and returned to supine position

## 2021-06-17 ENCOUNTER — Encounter (HOSPITAL_COMMUNITY): Payer: Self-pay | Admitting: Orthopedic Surgery

## 2021-06-17 NOTE — Discharge Summary (Signed)
Physician Discharge Summary   Patient ID: Corey Arnold MRN: [id number] DOB/AGE: 1956/04/06 65 y.o.  Admit date: 06/12/2021 Discharge date: 06/12/2021  Primary Diagnosis: Osteoarthritis, right hip   Admission Diagnoses:  Past Medical History:  Diagnosis Date   ADHD (attention deficit hyperactivity disorder)    Cancer ([facility])    Cervical radiculopathy at C7    History of colon polyps 09/18/2006   Osteopenia    Prostatitis    Substance abuse ([place])    Discharge Diagnoses:   Principal Problem:   OA (osteoarthritis) of hip Active Problems:   S/P total right hip arthroplasty  Estimated body mass index is 25.67 kg/m as calculated from the following:   Height as of this encounter: 5\' 9"  (1.753 m).   Weight as of this encounter: 78.8 kg.  Procedure:  Procedure(s) (LRB): TOTAL HIP ARTHROPLASTY ANTERIOR APPROACH (Right)   Consults: None  HPI: Corey Arnold is a 65 y.o. male who has advanced end-  stage arthritis of their Right  hip with progressively worsening pain and dysfunction.The patient has failed nonoperative management and presents for total hip arthroplasty.   Laboratory Data: Admission on 06/12/2021, Discharged on 06/12/2021  Component Date Value Ref Range Status   ABO/RH(D) 06/12/2021    Final                   Value:O POS Performed at [facility] [address] [place], [place] [address]   Orders Only on 06/10/2021  Component Date Value Ref Range Status   SARS Coronavirus 2 06/10/2021 RESULT: NEGATIVE   Final   Comment: RESULT: NEGATIVESARS-CoV-2 INTERPRETATION:A NEGATIVE  test result means that SARS-CoV-2 RNA was not present in the specimen above the limit of detection of this test. This does not preclude a possible SARS-CoV-2 infection and should not be used as the  sole basis for patient management decisions. Negative results must be combined with clinical observations, patient history, and epidemiological information. Optimum specimen  types and timing for peak viral levels during infections caused by SARS-CoV-2  have not been determined. Collection of multiple specimens or types of specimens may be necessary to detect virus. Improper specimen collection and handling, sequence variability under primers/probes, or organism present below the limit of detection may  lead to false negative results. Positive and negative predictive values of testing are highly dependent on prevalence. False negative test results are more likely when prevalence of disease is high.The expected result is NEGATIVE.Fact S                          heet for  Healthcare Providers: LocalChronicle.no Sheet for Patients: SalonLookup.es Reference Range - Negative   Hospital Outpatient Visit on 06/07/2021  Component Date Value Ref Range Status   WBC 06/07/2021 10.0  4.0 - 10.5 K/uL Final   RBC 06/07/2021 4.43  4.22 - 5.81 MIL/uL Final   Hemoglobin 06/07/2021 13.9  13.0 - 17.0 g/dL Final   HCT 06/07/2021 42.7  39.0 - 52.0 % Final   MCV 06/07/2021 96.4  80.0 - 100.0 fL Final   MCH 06/07/2021 31.4  26.0 - 34.0 pg Final   MCHC 06/07/2021 32.6  30.0 - 36.0 g/dL Final   RDW 06/07/2021 13.2  11.5 - 15.5 % Final   Platelets 06/07/2021 261  150 - 400 K/uL Final   nRBC 06/07/2021 0.0  0.0 - 0.2 % Final   Performed at [facility], [address] [place],  [place] [address]   Sodium 06/07/2021 137  135 - 145 mmol/L Final   Potassium 06/07/2021 3.7  3.5 - 5.1 mmol/L Final   Chloride 06/07/2021 108  98 - 111 mmol/L Final   CO2 06/07/2021 26  22 - 32 mmol/L Final   Glucose, Bld 06/07/2021 131 (A)  70 - 99 mg/dL Final   Glucose reference range applies only to samples taken after fasting for at least 8 hours.   BUN 06/07/2021 23  8 - 23 mg/dL Final   Creatinine, Ser 06/07/2021 0.68  0.61 - 1.24 mg/dL Final   Calcium 06/07/2021 9.0  8.9 - 10.3 mg/dL Final   Total Protein 06/07/2021 6.8  6.5 -  8.1 g/dL Final   Albumin 06/07/2021 4.0  3.5 - 5.0 g/dL Final   AST 06/07/2021 14 (A)  15 - 41 U/L Final   ALT 06/07/2021 13  0 - 44 U/L Final   Alkaline Phosphatase 06/07/2021 54  38 - 126 U/L Final   Total Bilirubin 06/07/2021 0.7  0.3 - 1.2 mg/dL Final   GFR, Estimated 06/07/2021 >60  >60 mL/min Final   Comment: (NOTE) Calculated using the CKD-EPI Creatinine Equation (2021)    Anion gap 06/07/2021 3 (A)  5 - 15 Final   Performed at [facility], [place] [address] [place], [place] [address]   ABO/RH(D) 06/07/2021 O POS   Final   Antibody Screen 06/07/2021 NEG   Final   Sample Expiration 06/07/2021 06/15/2021,2359   Final   Extend sample reason 06/07/2021    Final                   Value:NO TRANSFUSIONS OR PREGNANCY IN THE PAST 3 MONTHS Performed at [facility] [address] [place], [place] [address]    Prothrombin Time 06/07/2021 12.6  11.4 - 15.2 seconds Final   INR 06/07/2021 0.9  0.8 - 1.2 Final   Comment: (NOTE) INR goal varies based on device and disease states. Performed at [facility], [place] [address] [place], [place] [address]    MRSA, PCR 06/07/2021 NEGATIVE  NEGATIVE Final   Staphylococcus aureus 06/07/2021 NEGATIVE  NEGATIVE Final   Comment: (NOTE) The Xpert SA Assay (FDA approved for NASAL specimens in patients 1 years of age and older), is one component of a comprehensive surveillance program. It is not intended to diagnose infection nor to guide or monitor treatment. Performed at [facility], [place] [address], [place], [place] [address]      X-Rays:DG Pelvis Portable  Result Date: 06/12/2021 CLINICAL DATA:  Postop right hip EXAM: PORTABLE PELVIS 1 VIEWS COMPARISON:  None. FINDINGS: Interval postsurgical changes from right total hip arthroplasty. Arthroplasty components appear in their expected alignment. No periprosthetic fracture is identified. Expected postoperative changes  within the overlying soft tissues. IMPRESSION: Expected postsurgical changes of right total hip arthroplasty. Electronically Signed   By: Yetta Glassman M.D.   On: 06/12/2021 13:04   DG C-Arm 1-60 Min-No Report  Result Date: 06/12/2021 CLINICAL DATA:  Status post right hip replacement. EXAM: OPERATIVE right HIP (WITH PELVIS IF PERFORMED) 2 VIEWS TECHNIQUE: Fluoroscopic spot image(s) were submitted for interpretation post-operatively. Radiation exposure index: 0.808 mGy. COMPARISON:  None. FINDINGS: Two intraoperative fluoroscopic images demonstrate the right acetabular and femoral components to be well situated. IMPRESSION: Fluoroscopic guidance provided during right total hip arthroplasty. Electronically Signed   By: Marijo Conception M.D.   On: 06/12/2021 11:51   DG HIP OPERATIVE  UNILAT W OR W/O PELVIS RIGHT  Result Date: 06/12/2021 CLINICAL DATA:  Status post right hip replacement. EXAM: OPERATIVE right HIP (WITH PELVIS IF PERFORMED) 2 VIEWS TECHNIQUE: Fluoroscopic spot image(s) were submitted for interpretation post-operatively. Radiation exposure index: 0.808 mGy. COMPARISON:  None. FINDINGS: Two intraoperative fluoroscopic images demonstrate the right acetabular and femoral components to be well situated. IMPRESSION: Fluoroscopic guidance provided during right total hip arthroplasty. Electronically Signed   By: Marijo Conception M.D.   On: 06/12/2021 11:51    EKG:No orders found for this or any previous visit.   Hospital Course: Corey Arnold is a 65 y.o. who was admitted to [facility]. They were brought to the operating room on 06/12/2021 and underwent Procedure(s): TOTAL HIP ARTHROPLASTY ANTERIOR APPROACH.  Patient tolerated the procedure well and was later transferred to the recovery room for postoperative care. They were given PO and IV analgesics for pain control following their surgery. They were given 24 hours of postoperative antibiotics of  Anti-infectives (From admission,  onward)    Start     Dose/Rate Route Frequency Ordered Stop   06/12/21 1615  ceFAZolin (ANCEF) IVPB 2g/100 mL premix  Status:  Discontinued        2 g 200 mL/hr over 30 Minutes Intravenous Every 6 hours 06/12/21 1151 06/12/21 2047   06/12/21 0945  ceFAZolin (ANCEF) IVPB 2g/100 mL premix  Status:  Discontinued        2 g 200 mL/hr over 30 Minutes Intravenous Every 6 hours 06/12/21 0944 06/12/21 1214   06/12/21 0830  ceFAZolin (ANCEF) IVPB 2g/100 mL premix  Status:  Discontinued        2 g 200 mL/hr over 30 Minutes Intravenous On call to O.R. 06/12/21 0865 06/12/21 1027      and started on DVT prophylaxis in the form of Aspirin. Physical therapy was ordered for total joint protocol. Patient received three normal saline boluses in recovery and completed a session of physical therapy. Pain was well controlled with medications, patient was able to void on his own, and was meeting his goals with therapy. Pt was discharged to home on a same day discharge in stable condition.  Diet: Regular diet Activity: WBAT Follow-up: in 2 weeks Disposition: Home with HEP Discharged Condition: stable   Discharge Instructions     Call MD / Call 911   Complete by: As directed    If you experience chest pain or shortness of breath, CALL 911 and be transported to the hospital emergency room.  If you develope a fever above 101 F, pus (white drainage) or increased drainage or redness at the wound, or calf pain, call your surgeon's office.   Change dressing   Complete by: As directed    You have an adhesive waterproof bandage over the incision. Leave this in place until your first follow-up appointment. Once you remove this you will not need to place another bandage.   Constipation Prevention   Complete by: As directed    Drink plenty of fluids.  Prune juice may be helpful.  You may use a stool softener, such as Colace (over the counter) 100 mg twice a day.  Use MiraLax (over the counter) for constipation as  needed.   Diet - low sodium heart healthy   Complete by: As directed    Do not sit on low chairs, stoools or toilet seats, as it may be difficult to get up from low surfaces   Complete by: As directed  Driving restrictions   Complete by: As directed    No driving for two weeks   Post-operative opioid taper instructions:   Complete by: As directed    POST-OPERATIVE OPIOID TAPER INSTRUCTIONS: It is important to wean off of your opioid medication as soon as possible. If you do not need pain medication after your surgery it is ok to stop day one. Opioids include: Codeine, Hydrocodone(Norco, Vicodin), Oxycodone(Percocet, oxycontin) and hydromorphone amongst others.  Long term and even short term use of opiods can cause: Increased pain response Dependence Constipation Depression Respiratory depression And more.  Withdrawal symptoms can include Flu like symptoms Nausea, vomiting And more Techniques to manage these symptoms Hydrate well Eat regular healthy meals Stay active Use relaxation techniques(deep breathing, meditating, yoga) Do Not substitute Alcohol to help with tapering If you have been on opioids for less than two weeks and do not have pain than it is ok to stop all together.  Plan to wean off of opioids This plan should start within one week post op of your joint replacement. Maintain the same interval or time between taking each dose and first decrease the dose.  Cut the total daily intake of opioids by one tablet each day Next start to increase the time between doses. The last dose that should be eliminated is the evening dose.      TED hose   Complete by: As directed    Use stockings (TED hose) for three weeks on both leg(s).  You may remove them at night for sleeping.   Weight bearing as tolerated   Complete by: As directed       Allergies as of 06/12/2021       Reactions   Latex Itching        Medication List     TAKE these medications     acetaminophen 650 MG CR tablet Commonly known as: TYLENOL Take 1,300 mg by mouth every 8 (eight) hours as needed for pain.   albuterol 108 (90 Base) MCG/ACT inhaler Commonly known as: VENTOLIN HFA Inhale 1-2 puffs into the lungs every 6 (six) hours as needed for wheezing or shortness of breath.   aspirin 325 MG EC tablet Take 1 tablet (325 mg total) by mouth 2 (two) times daily for 21 days. Then take one 81 mg aspirin once a day for three weeks. Then discontinue aspirin.   dexlansoprazole 60 MG capsule Commonly known as: DEXILANT Take 60 mg by mouth daily.   doxycycline 100 MG capsule Commonly known as: VIBRAMYCIN Take 100 mg by mouth 2 (two) times daily. 5 DS   HYDROcodone-acetaminophen 5-325 MG tablet Commonly known as: NORCO/VICODIN Take 1-2 tablets by mouth every 4 (four) hours as needed for severe pain.   methocarbamol 500 MG tablet Commonly known as: ROBAXIN Take 1 tablet (500 mg total) by mouth every 6 (six) hours as needed for muscle spasms.   rosuvastatin 5 MG tablet Commonly known as: CRESTOR Take 5 mg by mouth every other day.   traMADol 50 MG tablet Commonly known as: ULTRAM Take 1-2 tablets (50-100 mg total) by mouth every 6 (six) hours as needed for moderate pain.   traZODone 100 MG tablet Commonly known as: DESYREL Take 100-200 mg by mouth at bedtime as needed.   Vitamin D 125 MCG (5000 UT) Caps Take 5,000 Units by mouth daily.               Discharge Care Instructions  (From admission, onward)  Start     Ordered   06/12/21 0000  Weight bearing as tolerated        06/12/21 0951   06/12/21 0000  Change dressing       Comments: You have an adhesive waterproof bandage over the incision. Leave this in place until your first follow-up appointment. Once you remove this you will not need to place another bandage.   06/12/21 7893            Follow-up Information     Gaynelle Arabian, MD Follow up in 2 week(s).   Specialty:  Orthopedic Surgery Contact information: [address] [place] [address] [phone]                 Signed: Theresa Duty, PA-C Orthopedic Surgery 06/17/2021, 7:41 AM

## 2021-07-04 DIAGNOSIS — Z85820 Personal history of malignant melanoma of skin: Secondary | ICD-10-CM | POA: Diagnosis not present

## 2021-07-04 DIAGNOSIS — L570 Actinic keratosis: Secondary | ICD-10-CM | POA: Diagnosis not present

## 2021-07-04 DIAGNOSIS — D2262 Melanocytic nevi of left upper limb, including shoulder: Secondary | ICD-10-CM | POA: Diagnosis not present

## 2021-07-04 DIAGNOSIS — D225 Melanocytic nevi of trunk: Secondary | ICD-10-CM | POA: Diagnosis not present

## 2021-07-04 DIAGNOSIS — D2261 Melanocytic nevi of right upper limb, including shoulder: Secondary | ICD-10-CM | POA: Diagnosis not present

## 2021-07-05 DIAGNOSIS — R634 Abnormal weight loss: Secondary | ICD-10-CM | POA: Diagnosis not present

## 2021-07-05 DIAGNOSIS — M8580 Other specified disorders of bone density and structure, unspecified site: Secondary | ICD-10-CM | POA: Diagnosis not present

## 2021-07-05 DIAGNOSIS — N401 Enlarged prostate with lower urinary tract symptoms: Secondary | ICD-10-CM | POA: Diagnosis not present

## 2021-07-05 DIAGNOSIS — E785 Hyperlipidemia, unspecified: Secondary | ICD-10-CM | POA: Diagnosis not present

## 2021-07-05 DIAGNOSIS — J449 Chronic obstructive pulmonary disease, unspecified: Secondary | ICD-10-CM | POA: Diagnosis not present

## 2021-07-05 DIAGNOSIS — R7301 Impaired fasting glucose: Secondary | ICD-10-CM | POA: Diagnosis not present

## 2021-07-26 DIAGNOSIS — Z4789 Encounter for other orthopedic aftercare: Secondary | ICD-10-CM | POA: Diagnosis not present

## 2021-07-30 DIAGNOSIS — R194 Change in bowel habit: Secondary | ICD-10-CM | POA: Diagnosis not present

## 2021-07-30 DIAGNOSIS — K219 Gastro-esophageal reflux disease without esophagitis: Secondary | ICD-10-CM | POA: Diagnosis not present

## 2021-07-30 DIAGNOSIS — K5900 Constipation, unspecified: Secondary | ICD-10-CM | POA: Diagnosis not present

## 2021-07-30 DIAGNOSIS — K5730 Diverticulosis of large intestine without perforation or abscess without bleeding: Secondary | ICD-10-CM | POA: Diagnosis not present

## 2021-08-06 DIAGNOSIS — H6123 Impacted cerumen, bilateral: Secondary | ICD-10-CM | POA: Diagnosis not present

## 2021-08-27 DIAGNOSIS — M19011 Primary osteoarthritis, right shoulder: Secondary | ICD-10-CM | POA: Diagnosis not present

## 2021-08-27 DIAGNOSIS — M25511 Pain in right shoulder: Secondary | ICD-10-CM | POA: Diagnosis not present

## 2021-08-30 DIAGNOSIS — H903 Sensorineural hearing loss, bilateral: Secondary | ICD-10-CM | POA: Diagnosis not present

## 2021-09-11 DIAGNOSIS — J449 Chronic obstructive pulmonary disease, unspecified: Secondary | ICD-10-CM | POA: Diagnosis not present

## 2021-09-11 DIAGNOSIS — R079 Chest pain, unspecified: Secondary | ICD-10-CM | POA: Diagnosis not present

## 2022-01-08 DIAGNOSIS — F4323 Adjustment disorder with mixed anxiety and depressed mood: Secondary | ICD-10-CM | POA: Diagnosis not present

## 2022-01-10 DIAGNOSIS — R7301 Impaired fasting glucose: Secondary | ICD-10-CM | POA: Diagnosis not present

## 2022-01-16 DIAGNOSIS — L570 Actinic keratosis: Secondary | ICD-10-CM | POA: Diagnosis not present

## 2022-01-16 DIAGNOSIS — L821 Other seborrheic keratosis: Secondary | ICD-10-CM | POA: Diagnosis not present

## 2022-01-16 DIAGNOSIS — D225 Melanocytic nevi of trunk: Secondary | ICD-10-CM | POA: Diagnosis not present

## 2022-01-16 DIAGNOSIS — Z85820 Personal history of malignant melanoma of skin: Secondary | ICD-10-CM | POA: Diagnosis not present

## 2022-02-28 DIAGNOSIS — M5116 Intervertebral disc disorders with radiculopathy, lumbar region: Secondary | ICD-10-CM | POA: Diagnosis not present

## 2022-03-03 ENCOUNTER — Other Ambulatory Visit (HOSPITAL_BASED_OUTPATIENT_CLINIC_OR_DEPARTMENT_OTHER): Payer: Self-pay | Admitting: Endocrinology

## 2022-03-03 ENCOUNTER — Other Ambulatory Visit: Payer: Self-pay | Admitting: Endocrinology

## 2022-03-03 ENCOUNTER — Other Ambulatory Visit (HOSPITAL_COMMUNITY): Payer: Self-pay | Admitting: Endocrinology

## 2022-03-03 DIAGNOSIS — M5116 Intervertebral disc disorders with radiculopathy, lumbar region: Secondary | ICD-10-CM

## 2022-03-07 ENCOUNTER — Ambulatory Visit (HOSPITAL_BASED_OUTPATIENT_CLINIC_OR_DEPARTMENT_OTHER)
Admission: RE | Admit: 2022-03-07 | Discharge: 2022-03-07 | Disposition: A | Discharge status: Home / Self Care | Payer: BC Managed Care – PPO | Source: Ambulatory Visit | Attending: Endocrinology | Admitting: Endocrinology

## 2022-03-07 DIAGNOSIS — M5116 Intervertebral disc disorders with radiculopathy, lumbar region: Secondary | ICD-10-CM | POA: Diagnosis not present

## 2022-03-07 DIAGNOSIS — M48061 Spinal stenosis, lumbar region without neurogenic claudication: Secondary | ICD-10-CM | POA: Diagnosis not present

## 2022-03-07 DIAGNOSIS — M5126 Other intervertebral disc displacement, lumbar region: Secondary | ICD-10-CM | POA: Diagnosis not present

## 2022-03-07 DIAGNOSIS — Z85820 Personal history of malignant melanoma of skin: Secondary | ICD-10-CM | POA: Diagnosis not present

## 2022-03-07 DIAGNOSIS — Z981 Arthrodesis status: Secondary | ICD-10-CM | POA: Diagnosis not present

## 2022-03-28 DIAGNOSIS — M7062 Trochanteric bursitis, left hip: Secondary | ICD-10-CM | POA: Diagnosis not present

## 2022-03-28 DIAGNOSIS — Z96642 Presence of left artificial hip joint: Secondary | ICD-10-CM | POA: Diagnosis not present

## 2022-05-09 DIAGNOSIS — M5416 Radiculopathy, lumbar region: Secondary | ICD-10-CM | POA: Diagnosis not present

## 2022-05-29 DIAGNOSIS — M5136 Other intervertebral disc degeneration, lumbar region: Secondary | ICD-10-CM | POA: Diagnosis not present

## 2022-05-29 DIAGNOSIS — M48061 Spinal stenosis, lumbar region without neurogenic claudication: Secondary | ICD-10-CM | POA: Diagnosis not present

## 2022-05-29 DIAGNOSIS — M5416 Radiculopathy, lumbar region: Secondary | ICD-10-CM | POA: Diagnosis not present

## 2022-06-10 DIAGNOSIS — M5416 Radiculopathy, lumbar region: Secondary | ICD-10-CM | POA: Diagnosis not present

## 2022-06-11 DIAGNOSIS — M5416 Radiculopathy, lumbar region: Secondary | ICD-10-CM | POA: Diagnosis not present

## 2022-06-11 DIAGNOSIS — R10817 Generalized abdominal tenderness: Secondary | ICD-10-CM | POA: Diagnosis not present

## 2022-06-26 ENCOUNTER — Other Ambulatory Visit: Payer: Self-pay | Admitting: Family Medicine

## 2022-06-26 ENCOUNTER — Ambulatory Visit
Admission: RE | Admit: 2022-06-26 | Discharge: 2022-06-26 | Disposition: A | Discharge status: Home / Self Care | Payer: BC Managed Care – PPO | Source: Ambulatory Visit | Attending: Family Medicine | Admitting: Family Medicine

## 2022-06-26 DIAGNOSIS — N200 Calculus of kidney: Secondary | ICD-10-CM | POA: Diagnosis not present

## 2022-06-26 DIAGNOSIS — I700 Atherosclerosis of aorta: Secondary | ICD-10-CM | POA: Diagnosis not present

## 2022-06-26 DIAGNOSIS — R10817 Generalized abdominal tenderness: Secondary | ICD-10-CM

## 2022-06-26 DIAGNOSIS — Z981 Arthrodesis status: Secondary | ICD-10-CM | POA: Diagnosis not present

## 2022-06-26 DIAGNOSIS — R197 Diarrhea, unspecified: Secondary | ICD-10-CM | POA: Diagnosis not present

## 2022-06-26 MED ORDER — IOPAMIDOL (ISOVUE-300) INJECTION 61%
100.0000 mL | Freq: Once | INTRAVENOUS | Status: AC | PRN
  Administered 2022-06-26: 100 mL via INTRAVENOUS

## 2022-07-01 DIAGNOSIS — R634 Abnormal weight loss: Secondary | ICD-10-CM | POA: Diagnosis not present

## 2022-07-01 DIAGNOSIS — R194 Change in bowel habit: Secondary | ICD-10-CM | POA: Diagnosis not present

## 2022-07-01 DIAGNOSIS — K5730 Diverticulosis of large intestine without perforation or abscess without bleeding: Secondary | ICD-10-CM | POA: Diagnosis not present

## 2022-07-01 DIAGNOSIS — R1033 Periumbilical pain: Secondary | ICD-10-CM | POA: Diagnosis not present

## 2022-07-11 DIAGNOSIS — Z125 Encounter for screening for malignant neoplasm of prostate: Secondary | ICD-10-CM | POA: Diagnosis not present

## 2022-07-11 DIAGNOSIS — E559 Vitamin D deficiency, unspecified: Secondary | ICD-10-CM | POA: Diagnosis not present

## 2022-07-11 DIAGNOSIS — Z1211 Encounter for screening for malignant neoplasm of colon: Secondary | ICD-10-CM | POA: Diagnosis not present

## 2022-07-11 DIAGNOSIS — R7301 Impaired fasting glucose: Secondary | ICD-10-CM | POA: Diagnosis not present

## 2022-07-11 DIAGNOSIS — E785 Hyperlipidemia, unspecified: Secondary | ICD-10-CM | POA: Diagnosis not present

## 2022-07-15 DIAGNOSIS — M5416 Radiculopathy, lumbar region: Secondary | ICD-10-CM | POA: Diagnosis not present

## 2022-07-15 DIAGNOSIS — M5116 Intervertebral disc disorders with radiculopathy, lumbar region: Secondary | ICD-10-CM | POA: Diagnosis not present

## 2022-07-18 ENCOUNTER — Other Ambulatory Visit: Payer: Self-pay | Admitting: Endocrinology

## 2022-07-18 DIAGNOSIS — R82998 Other abnormal findings in urine: Secondary | ICD-10-CM | POA: Diagnosis not present

## 2022-07-18 DIAGNOSIS — J449 Chronic obstructive pulmonary disease, unspecified: Secondary | ICD-10-CM

## 2022-07-18 DIAGNOSIS — Z1339 Encounter for screening examination for other mental health and behavioral disorders: Secondary | ICD-10-CM | POA: Diagnosis not present

## 2022-07-18 DIAGNOSIS — C4330 Malignant melanoma of unspecified part of face: Secondary | ICD-10-CM | POA: Diagnosis not present

## 2022-07-18 DIAGNOSIS — Z008 Encounter for other general examination: Secondary | ICD-10-CM | POA: Diagnosis not present

## 2022-07-18 DIAGNOSIS — Z1331 Encounter for screening for depression: Secondary | ICD-10-CM | POA: Diagnosis not present

## 2022-07-22 DIAGNOSIS — D485 Neoplasm of uncertain behavior of skin: Secondary | ICD-10-CM | POA: Diagnosis not present

## 2022-07-22 DIAGNOSIS — C44311 Basal cell carcinoma of skin of nose: Secondary | ICD-10-CM | POA: Diagnosis not present

## 2022-07-22 DIAGNOSIS — D225 Melanocytic nevi of trunk: Secondary | ICD-10-CM | POA: Diagnosis not present

## 2022-07-22 DIAGNOSIS — L821 Other seborrheic keratosis: Secondary | ICD-10-CM | POA: Diagnosis not present

## 2022-07-22 DIAGNOSIS — Z85820 Personal history of malignant melanoma of skin: Secondary | ICD-10-CM | POA: Diagnosis not present

## 2022-07-22 DIAGNOSIS — D0471 Carcinoma in situ of skin of right lower limb, including hip: Secondary | ICD-10-CM | POA: Diagnosis not present

## 2022-08-04 DIAGNOSIS — E785 Hyperlipidemia, unspecified: Secondary | ICD-10-CM | POA: Diagnosis not present

## 2022-08-04 DIAGNOSIS — M5416 Radiculopathy, lumbar region: Secondary | ICD-10-CM | POA: Diagnosis not present

## 2022-08-04 DIAGNOSIS — E291 Testicular hypofunction: Secondary | ICD-10-CM | POA: Diagnosis not present

## 2022-08-04 DIAGNOSIS — R61 Generalized hyperhidrosis: Secondary | ICD-10-CM | POA: Diagnosis not present

## 2022-08-21 ENCOUNTER — Inpatient Hospital Stay: Admission: RE | Admit: 2022-08-21 | Payer: BC Managed Care – PPO | Source: Ambulatory Visit

## 2022-09-02 DIAGNOSIS — C44311 Basal cell carcinoma of skin of nose: Secondary | ICD-10-CM | POA: Diagnosis not present

## 2022-10-09 ENCOUNTER — Ambulatory Visit
Admission: RE | Admit: 2022-10-09 | Discharge: 2022-10-09 | Disposition: A | Discharge status: Home / Self Care | Payer: BC Managed Care – PPO | Source: Ambulatory Visit | Attending: Endocrinology | Admitting: Endocrinology

## 2022-10-09 DIAGNOSIS — F17210 Nicotine dependence, cigarettes, uncomplicated: Secondary | ICD-10-CM | POA: Diagnosis not present

## 2022-10-09 DIAGNOSIS — J449 Chronic obstructive pulmonary disease, unspecified: Secondary | ICD-10-CM

## 2022-10-31 DIAGNOSIS — M25562 Pain in left knee: Secondary | ICD-10-CM | POA: Diagnosis not present

## 2022-10-31 DIAGNOSIS — Z96642 Presence of left artificial hip joint: Secondary | ICD-10-CM | POA: Diagnosis not present

## 2023-08-27 ENCOUNTER — Other Ambulatory Visit: Payer: Self-pay | Admitting: Endocrinology

## 2023-08-27 DIAGNOSIS — F17200 Nicotine dependence, unspecified, uncomplicated: Secondary | ICD-10-CM

## 2023-09-29 ENCOUNTER — Other Ambulatory Visit: Payer: BC Managed Care – PPO

## 2023-09-30 ENCOUNTER — Inpatient Hospital Stay: Admission: RE | Admit: 2023-09-30 | Payer: BC Managed Care – PPO | Source: Ambulatory Visit

## 2023-10-26 ENCOUNTER — Ambulatory Visit
Admission: RE | Admit: 2023-10-26 | Discharge: 2023-10-26 | Disposition: A | Discharge status: Home / Self Care | Payer: BLUE CROSS/BLUE SHIELD | Source: Ambulatory Visit | Attending: Endocrinology | Admitting: Endocrinology

## 2023-10-26 DIAGNOSIS — F17200 Nicotine dependence, unspecified, uncomplicated: Secondary | ICD-10-CM
